# Patient Record
Sex: Male | Born: 1965 | Race: Black or African American | Hispanic: No | Marital: Single | Smoking: Current every day smoker
Health system: Southern US, Community
[De-identification: ages and names within clinical notes are randomized; demographics above are authoritative.]

## PROBLEM LIST (undated history)

## (undated) DIAGNOSIS — I10 Essential (primary) hypertension: Secondary | ICD-10-CM

## (undated) DIAGNOSIS — R7989 Other specified abnormal findings of blood chemistry: Secondary | ICD-10-CM

## (undated) DIAGNOSIS — F1010 Alcohol abuse, uncomplicated: Secondary | ICD-10-CM

## (undated) DIAGNOSIS — H543 Unqualified visual loss, both eyes: Secondary | ICD-10-CM

## (undated) DIAGNOSIS — E119 Type 2 diabetes mellitus without complications: Secondary | ICD-10-CM

## (undated) DIAGNOSIS — K760 Fatty (change of) liver, not elsewhere classified: Secondary | ICD-10-CM

## (undated) HISTORY — DX: Other specified abnormal findings of blood chemistry: R79.89

---

## 2002-01-15 ENCOUNTER — Encounter: Payer: Self-pay | Admitting: Internal Medicine

## 2002-01-15 ENCOUNTER — Emergency Department (HOSPITAL_COMMUNITY): Admission: EM | Admit: 2002-01-15 | Discharge: 2002-01-15 | Payer: Self-pay | Admitting: Emergency Medicine

## 2002-01-17 ENCOUNTER — Emergency Department (HOSPITAL_COMMUNITY): Admission: EM | Admit: 2002-01-17 | Discharge: 2002-01-17 | Payer: Self-pay | Admitting: Emergency Medicine

## 2002-02-10 ENCOUNTER — Ambulatory Visit (HOSPITAL_BASED_OUTPATIENT_CLINIC_OR_DEPARTMENT_OTHER): Admission: RE | Admit: 2002-02-10 | Discharge: 2002-02-10 | Payer: Self-pay | Admitting: *Deleted

## 2002-09-11 ENCOUNTER — Emergency Department (HOSPITAL_COMMUNITY): Admission: EM | Admit: 2002-09-11 | Discharge: 2002-09-11 | Payer: Self-pay | Admitting: Emergency Medicine

## 2002-09-11 ENCOUNTER — Encounter: Payer: Self-pay | Admitting: Emergency Medicine

## 2003-01-27 ENCOUNTER — Inpatient Hospital Stay (HOSPITAL_COMMUNITY): Admission: AC | Admit: 2003-01-27 | Discharge: 2003-02-01 | Payer: Self-pay

## 2003-02-07 ENCOUNTER — Ambulatory Visit (HOSPITAL_COMMUNITY): Admission: RE | Admit: 2003-02-07 | Discharge: 2003-02-08 | Payer: Self-pay | Admitting: Ophthalmology

## 2003-04-11 ENCOUNTER — Ambulatory Visit (HOSPITAL_COMMUNITY): Admission: RE | Admit: 2003-04-11 | Discharge: 2003-04-11 | Payer: Self-pay | Admitting: General Surgery

## 2004-01-30 ENCOUNTER — Ambulatory Visit (HOSPITAL_COMMUNITY): Admission: RE | Admit: 2004-01-30 | Discharge: 2004-01-30 | Payer: Self-pay | Admitting: Ophthalmology

## 2005-07-29 ENCOUNTER — Ambulatory Visit (HOSPITAL_COMMUNITY): Admission: RE | Admit: 2005-07-29 | Discharge: 2005-07-29 | Payer: Self-pay | Admitting: Specialist

## 2008-01-14 ENCOUNTER — Emergency Department (HOSPITAL_COMMUNITY): Admission: EM | Admit: 2008-01-14 | Discharge: 2008-01-14 | Payer: Self-pay | Admitting: Emergency Medicine

## 2008-08-27 ENCOUNTER — Encounter: Payer: Self-pay | Admitting: Emergency Medicine

## 2008-08-28 ENCOUNTER — Ambulatory Visit: Payer: Self-pay | Admitting: *Deleted

## 2008-08-28 ENCOUNTER — Inpatient Hospital Stay (HOSPITAL_COMMUNITY): Admission: EM | Admit: 2008-08-28 | Discharge: 2008-08-31 | Payer: Self-pay | Admitting: *Deleted

## 2009-10-15 ENCOUNTER — Emergency Department (HOSPITAL_COMMUNITY): Admission: EM | Admit: 2009-10-15 | Discharge: 2009-10-15 | Payer: Self-pay | Admitting: Emergency Medicine

## 2010-06-16 LAB — COMPREHENSIVE METABOLIC PANEL
ALT: 104 U/L — ABNORMAL HIGH (ref 0–53)
AST: 81 U/L — ABNORMAL HIGH (ref 0–37)
Albumin: 4.4 g/dL (ref 3.5–5.2)
Alkaline Phosphatase: 61 U/L (ref 39–117)
BUN: 10 mg/dL (ref 6–23)
CO2: 26 mEq/L (ref 19–32)
Calcium: 9.4 mg/dL (ref 8.4–10.5)
Chloride: 103 mEq/L (ref 96–112)
Creatinine, Ser: 1.3 mg/dL (ref 0.4–1.5)
GFR calc Af Amer: >60 mL/min (ref >60)
GFR calc non Af Amer: 60 mL/min — ABNORMAL LOW (ref >60)
Glucose, Bld: 103 mg/dL — ABNORMAL HIGH (ref 70–99)
Potassium: 4.5 mEq/L (ref 3.5–5.1)
Sodium: 137 mEq/L (ref 135–145)
Total Bilirubin: 0.9 mg/dL (ref 0.3–1.2)
Total Protein: 8.2 g/dL (ref 6.0–8.3)

## 2010-06-16 LAB — POCT CARDIAC MARKERS
CKMB, poc: 2 ng/mL (ref 1.0–8.0)
CKMB, poc: 2.4 ng/mL (ref 1.0–8.0)
Myoglobin, poc: 171 ng/mL (ref 12–200)
Myoglobin, poc: 190 ng/mL (ref 12–200)
Troponin i, poc: <0.05 ng/mL (ref 0.00–0.09)
Troponin i, poc: <0.05 ng/mL (ref 0.00–0.09)

## 2010-06-16 LAB — CBC
HCT: 45.6 % (ref 39.0–52.0)
Hemoglobin: 15.9 g/dL (ref 13.0–17.0)
MCH: 33.7 pg (ref 26.0–34.0)
MCHC: 34.8 g/dL (ref 30.0–36.0)
MCV: 96.9 fL (ref 78.0–100.0)
Platelets: 236 10*3/uL (ref 150–400)
RBC: 4.71 MIL/uL (ref 4.22–5.81)
RDW: 13.7 % (ref 11.5–15.5)
WBC: 8.1 10*3/uL (ref 4.0–10.5)

## 2010-06-16 LAB — LIPASE, BLOOD: Lipase: 35 U/L (ref 11–59)

## 2010-07-10 LAB — DIFFERENTIAL
Basophils Absolute: 0.1 10*3/uL (ref 0.0–0.1)
Basophils Relative: 1 % (ref 0–1)
Eosinophils Absolute: 0.1 10*3/uL (ref 0.0–0.7)
Eosinophils Relative: 2 % (ref 0–5)
Lymphocytes Relative: 27 % (ref 12–46)
Lymphs Abs: 1.8 10*3/uL (ref 0.7–4.0)
Monocytes Absolute: 0.8 10*3/uL (ref 0.1–1.0)
Monocytes Relative: 12 % (ref 3–12)
Neutro Abs: 3.8 10*3/uL (ref 1.7–7.7)
Neutrophils Relative %: 58 % (ref 43–77)

## 2010-07-10 LAB — CBC
HCT: 45.4 % (ref 39.0–52.0)
Hemoglobin: 16 g/dL (ref 13.0–17.0)
MCHC: 35.3 g/dL (ref 30.0–36.0)
MCV: 96 fL (ref 78.0–100.0)
Platelets: 268 10*3/uL (ref 150–400)
RBC: 4.73 MIL/uL (ref 4.22–5.81)
RDW: 14.1 % (ref 11.5–15.5)
WBC: 6.5 10*3/uL (ref 4.0–10.5)

## 2010-07-10 LAB — URINALYSIS, ROUTINE W REFLEX MICROSCOPIC
Bilirubin Urine: NEGATIVE
Glucose, UA: NEGATIVE mg/dL
Hgb urine dipstick: NEGATIVE
Ketones, ur: NEGATIVE mg/dL
Nitrite: NEGATIVE
Protein, ur: NEGATIVE mg/dL
Specific Gravity, Urine: >1.03 — ABNORMAL HIGH (ref 1.005–1.030)
Urobilinogen, UA: 0.2 mg/dL (ref 0.0–1.0)
pH: 5.5 (ref 5.0–8.0)

## 2010-07-10 LAB — HEPATIC FUNCTION PANEL
ALT: 35 U/L (ref 0–53)
AST: 38 U/L — ABNORMAL HIGH (ref 0–37)
Albumin: 4.4 g/dL (ref 3.5–5.2)
Alkaline Phosphatase: 71 U/L (ref 39–117)
Bilirubin, Direct: 0.1 mg/dL (ref 0.0–0.3)
Indirect Bilirubin: 0.3 mg/dL (ref 0.3–0.9)
Total Bilirubin: 0.4 mg/dL (ref 0.3–1.2)
Total Protein: 8.2 g/dL (ref 6.0–8.3)

## 2010-07-10 LAB — ACETAMINOPHEN LEVEL: Acetaminophen (Tylenol), Serum: <10 ug/mL — ABNORMAL LOW (ref 10–30)

## 2010-07-10 LAB — ETHANOL: Alcohol, Ethyl (B): <5 mg/dL (ref 0–10)

## 2010-07-10 LAB — RAPID URINE DRUG SCREEN, HOSP PERFORMED
Amphetamines: NOT DETECTED
Barbiturates: NOT DETECTED
Benzodiazepines: NOT DETECTED
Cocaine: NOT DETECTED
Opiates: NOT DETECTED
Tetrahydrocannabinol: NOT DETECTED

## 2010-07-10 LAB — BASIC METABOLIC PANEL
BUN: 11 mg/dL (ref 6–23)
CO2: 26 mEq/L (ref 19–32)
Calcium: 10 mg/dL (ref 8.4–10.5)
Chloride: 105 mEq/L (ref 96–112)
Creatinine, Ser: 1.03 mg/dL (ref 0.4–1.5)
GFR calc Af Amer: >60 mL/min (ref >60)
GFR calc non Af Amer: >60 mL/min (ref >60)
Glucose, Bld: 95 mg/dL (ref 70–99)
Potassium: 4.3 mEq/L (ref 3.5–5.1)
Sodium: 139 mEq/L (ref 135–145)

## 2010-07-10 LAB — SALICYLATE LEVEL: Salicylate Lvl: <4 mg/dL (ref 2.8–20.0)

## 2010-07-12 ENCOUNTER — Emergency Department: Payer: Self-pay
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
## 2010-08-17 NOTE — Op Note (Signed)
NAME:  Austin Berry, CORAN NO.:  [id number]   MEDICAL RECORD NO.:  [id number]          PATIENT TYPE:  AMB   LOCATION:  SDS                          FACILITY:  [facility]   PHYSICIAN:  Herby Abraham., M.D.DATE OF BIRTH:  10-Jun-1965   DATE OF PROCEDURE:  DATE OF DISCHARGE:                                 OPERATIVE REPORT   No dictation for this job.           ______________________________  Herby Abraham., M.D.     LC/MEDQ  D:  07/29/2005  T:  07/30/2005  Job:  [id number]
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
## 2010-08-17 NOTE — Op Note (Signed)
   NAME:  Austin Berry, Austin Berry                         ACCOUNT NO.:  [id number]   MEDICAL RECORD NO.:  [id number]                   PATIENT TYPE:  AMB   LOCATION:  DSC                                  FACILITY:  [facility]   PHYSICIAN:  Lowell Bouton, M.D.      DATE OF BIRTH:  06-Apr-1965   DATE OF PROCEDURE:  02/10/2002  DATE OF DISCHARGE:                                 OPERATIVE REPORT   PREOPERATIVE DIAGNOSIS:  Crush amputation, left ring fingertip.   POSTOPERATIVE DIAGNOSIS:  Crush amputation, left ring fingertip.   PROCEDURE:  Revision of amputation, left ring fingertip.   SURGEON:  Lowell Bouton, M.D.   ANESTHESIA:  Marcaine 0.5% local in the minor room.   OPERATIVE FINDINGS:  The patient had necrosis of a portion of the distal  phalanx.  The skin was missing on the ulnar half of the distal phalanx.   DESCRIPTION OF PROCEDURE:  Under 0.5% Marcaine local anesthesia in the minor  room, the left hand was prepped and draped in the usual fashion and after  elevating the limb, the forearm tourniquet was inflated to 250 mmHg.  A  rongeur was used to debride the dead bone of the distal phalanx.  A radial  flap was then elevated and the bone was excised.  Any necrotic bone was  removed.  The radial flap was debrided and then brought ulnarly to cover  over the remaining bone.  The wound was irrigated copiously with saline.  The skin was repaired with 4-0 nylon sutures.  The nail bed and about 30% of  the proximal nail bed was left intact.  Sterile dressings were applied, the  tourniquet was released.  The patient was discharged in good condition.                                               Lowell Bouton, M.D.    EMM/MEDQ  D:  02/10/2002  T:  02/11/2002  Job:  [id number]

## 2010-12-25 ENCOUNTER — Emergency Department: Payer: Self-pay | Admitting: Emergency Medicine

## 2010-12-31 LAB — CBC
HCT: 46.7
MCV: 97
Platelets: 243
RDW: 13.9

## 2010-12-31 LAB — DIFFERENTIAL
Basophils Absolute: 0
Lymphocytes Relative: 17
Lymphs Abs: 1.2
Monocytes Absolute: 0.8
Monocytes Relative: 12
Neutro Abs: 5

## 2010-12-31 LAB — POCT CARDIAC MARKERS: Troponin i, poc: <0.05

## 2010-12-31 LAB — COMPREHENSIVE METABOLIC PANEL
Albumin: 3.9
BUN: 9
Calcium: 9
Creatinine, Ser: 1.1
Total Protein: 7.2

## 2011-06-03 ENCOUNTER — Emergency Department: Payer: Self-pay | Admitting: Emergency Medicine

## 2011-06-03 LAB — TSH: Thyroid Stimulating Horm: 0.59 u[IU]/mL

## 2011-06-03 LAB — CBC
HCT: 47.8 % (ref 40.0–52.0)
HGB: 16 g/dL (ref 13.0–18.0)
MCH: 33.1 pg (ref 26.0–34.0)
MCHC: 33.6 g/dL (ref 32.0–36.0)
MCV: 99 fL (ref 80–100)
RBC: 4.85 10*6/uL (ref 4.40–5.90)
WBC: 5.1 10*3/uL (ref 3.8–10.6)

## 2011-06-03 LAB — COMPREHENSIVE METABOLIC PANEL
Albumin: 3.8 g/dL (ref 3.4–5.0)
Anion Gap: 12 (ref 7–16)
Bilirubin,Total: 0.6 mg/dL (ref 0.2–1.0)
Calcium, Total: 9.3 mg/dL (ref 8.5–10.1)
Co2: 24 mmol/L (ref 21–32)
Creatinine: 1.14 mg/dL (ref 0.60–1.30)
EGFR (Non-African Amer.): >60
Glucose: 144 mg/dL — ABNORMAL HIGH (ref 65–99)
Osmolality: 278 (ref 275–301)
Sodium: 138 mmol/L (ref 136–145)

## 2011-06-03 LAB — TROPONIN I: Troponin-I: <0.02 ng/mL

## 2011-06-03 LAB — DRUG SCREEN, URINE
Barbiturates, Ur Screen: NEGATIVE (ref ?–200)
Cannabinoid 50 Ng, Ur ~~LOC~~: POSITIVE (ref ?–50)
Methadone, Ur Screen: NEGATIVE (ref ?–300)
Tricyclic, Ur Screen: NEGATIVE (ref ?–1000)

## 2012-01-30 ENCOUNTER — Ambulatory Visit: Payer: Self-pay | Admitting: Family Medicine

## 2012-06-01 ENCOUNTER — Emergency Department: Payer: Self-pay | Admitting: Emergency Medicine

## 2012-06-01 LAB — DRUG SCREEN, URINE
Benzodiazepine, Ur Scrn: NEGATIVE (ref ?–200)
Cannabinoid 50 Ng, Ur ~~LOC~~: POSITIVE (ref ?–50)
Cocaine Metabolite,Ur ~~LOC~~: POSITIVE (ref ?–300)
Opiate, Ur Screen: NEGATIVE (ref ?–300)
Phencyclidine (PCP) Ur S: NEGATIVE (ref ?–25)
Tricyclic, Ur Screen: NEGATIVE (ref ?–1000)

## 2012-06-01 LAB — COMPREHENSIVE METABOLIC PANEL
Albumin: 3.5 g/dL (ref 3.4–5.0)
Anion Gap: 1 — ABNORMAL LOW (ref 7–16)
Bilirubin,Total: 0.4 mg/dL (ref 0.2–1.0)
Creatinine: 1.28 mg/dL (ref 0.60–1.30)
Glucose: 154 mg/dL — ABNORMAL HIGH (ref 65–99)
Osmolality: 280 (ref 275–301)
SGOT(AST): 23 U/L (ref 15–37)
Total Protein: 7.7 g/dL (ref 6.4–8.2)

## 2012-06-01 LAB — URINALYSIS, COMPLETE
Blood: NEGATIVE
Glucose,UR: 150 mg/dL (ref 0–75)
Ketone: NEGATIVE
Protein: NEGATIVE
Squamous Epithelial: 1

## 2012-06-01 LAB — CBC
HCT: 46.4 % (ref 40.0–52.0)
MCHC: 33.3 g/dL (ref 32.0–36.0)
RBC: 4.76 10*6/uL (ref 4.40–5.90)
WBC: 9.7 10*3/uL (ref 3.8–10.6)

## 2012-06-08 ENCOUNTER — Emergency Department: Payer: Self-pay | Admitting: Emergency Medicine

## 2012-06-08 LAB — URINALYSIS, COMPLETE
Bacteria: NONE SEEN
Blood: NEGATIVE
Glucose,UR: >=500 mg/dL (ref 0–75)
Ketone: NEGATIVE
Leukocyte Esterase: NEGATIVE
Ph: 7 (ref 4.5–8.0)

## 2012-06-08 LAB — DRUG SCREEN, URINE
Benzodiazepine, Ur Scrn: POSITIVE (ref ?–200)
Cocaine Metabolite,Ur ~~LOC~~: NEGATIVE (ref ?–300)
Methadone, Ur Screen: NEGATIVE (ref ?–300)
Tricyclic, Ur Screen: NEGATIVE (ref ?–1000)

## 2012-06-08 LAB — COMPREHENSIVE METABOLIC PANEL
Albumin: 3.6 g/dL (ref 3.4–5.0)
BUN: 13 mg/dL (ref 7–18)
Bilirubin,Total: 0.1 mg/dL — ABNORMAL LOW (ref 0.2–1.0)
Glucose: 269 mg/dL — ABNORMAL HIGH (ref 65–99)
Potassium: 3.9 mmol/L (ref 3.5–5.1)
SGPT (ALT): 34 U/L (ref 12–78)

## 2012-06-08 LAB — CBC
HCT: 46.8 % (ref 40.0–52.0)
MCHC: 34 g/dL (ref 32.0–36.0)
RDW: 12.8 % (ref 11.5–14.5)
WBC: 9.8 10*3/uL (ref 3.8–10.6)

## 2012-06-08 LAB — ETHANOL
Ethanol %: <0.003 % (ref 0.000–0.080)
Ethanol: <3 mg/dL

## 2014-08-03 ENCOUNTER — Emergency Department
Admission: EM | Admit: 2014-08-03 | Discharge: 2014-08-05 | Disposition: A | Discharge status: Home / Self Care | Payer: Medicaid Other | Attending: Emergency Medicine | Admitting: Emergency Medicine

## 2014-08-03 DIAGNOSIS — F1210 Cannabis abuse, uncomplicated: Secondary | ICD-10-CM | POA: Insufficient documentation

## 2014-08-03 DIAGNOSIS — F99 Mental disorder, not otherwise specified: Secondary | ICD-10-CM | POA: Diagnosis present

## 2014-08-03 DIAGNOSIS — F1910 Other psychoactive substance abuse, uncomplicated: Secondary | ICD-10-CM

## 2014-08-03 DIAGNOSIS — F4310 Post-traumatic stress disorder, unspecified: Secondary | ICD-10-CM

## 2014-08-03 DIAGNOSIS — Z79899 Other long term (current) drug therapy: Secondary | ICD-10-CM | POA: Diagnosis not present

## 2014-08-03 DIAGNOSIS — F1410 Cocaine abuse, uncomplicated: Secondary | ICD-10-CM | POA: Diagnosis not present

## 2014-08-03 DIAGNOSIS — H547 Unspecified visual loss: Secondary | ICD-10-CM

## 2014-08-03 DIAGNOSIS — F1010 Alcohol abuse, uncomplicated: Secondary | ICD-10-CM | POA: Diagnosis not present

## 2014-08-03 DIAGNOSIS — I10 Essential (primary) hypertension: Secondary | ICD-10-CM | POA: Diagnosis not present

## 2014-08-03 HISTORY — DX: Essential (primary) hypertension: I10

## 2014-08-03 HISTORY — DX: Type 2 diabetes mellitus without complications: E11.9

## 2014-08-03 HISTORY — DX: Unqualified visual loss, both eyes: H54.3

## 2014-08-03 LAB — URINALYSIS COMPLETE WITH MICROSCOPIC (ARMC ONLY)
BACTERIA UA: NONE SEEN
BILIRUBIN URINE: NEGATIVE
Glucose, UA: NEGATIVE mg/dL
HGB URINE DIPSTICK: NEGATIVE
KETONES UR: NEGATIVE mg/dL
Nitrite: NEGATIVE
PH: 5 (ref 5.0–8.0)
Protein, ur: NEGATIVE mg/dL
SPECIFIC GRAVITY, URINE: 1.023 (ref 1.005–1.030)

## 2014-08-03 NOTE — ED Notes (Signed)
Pt here for alcohol and crack detox, denies any SI or HI.

## 2014-08-03 NOTE — ED Notes (Signed)
Pt changed into scrubs and belongings put in secure locker area 

## 2014-08-04 DIAGNOSIS — F1410 Cocaine abuse, uncomplicated: Secondary | ICD-10-CM

## 2014-08-04 DIAGNOSIS — F4310 Post-traumatic stress disorder, unspecified: Secondary | ICD-10-CM

## 2014-08-04 DIAGNOSIS — F1010 Alcohol abuse, uncomplicated: Secondary | ICD-10-CM

## 2014-08-04 DIAGNOSIS — H547 Unspecified visual loss: Secondary | ICD-10-CM

## 2014-08-04 DIAGNOSIS — I10 Essential (primary) hypertension: Secondary | ICD-10-CM

## 2014-08-04 LAB — CBC
HCT: 44.5 % (ref 40.0–52.0)
Hemoglobin: 14.8 g/dL (ref 13.0–18.0)
MCH: 32.1 pg (ref 26.0–34.0)
MCHC: 33.2 g/dL (ref 32.0–36.0)
MCV: 96.7 fL (ref 80.0–100.0)
Platelets: 289 10*3/uL (ref 150–440)
RBC: 4.6 MIL/uL (ref 4.40–5.90)
RDW: 12.9 % (ref 11.5–14.5)
WBC: 6.6 10*3/uL (ref 3.8–10.6)

## 2014-08-04 LAB — ETHANOL: Alcohol, Ethyl (B): <5 mg/dL (ref <5)

## 2014-08-04 LAB — COMPREHENSIVE METABOLIC PANEL
ALK PHOS: 66 U/L (ref 38–126)
ALT: 34 U/L (ref 17–63)
ANION GAP: 7 (ref 5–15)
AST: 33 U/L (ref 15–41)
Albumin: 4.1 g/dL (ref 3.5–5.0)
BUN: 17 mg/dL (ref 6–20)
CHLORIDE: 108 mmol/L (ref 101–111)
CO2: 25 mmol/L (ref 22–32)
Calcium: 9 mg/dL (ref 8.9–10.3)
Creatinine, Ser: 1.06 mg/dL (ref 0.61–1.24)
GLUCOSE: 116 mg/dL — AB (ref 65–99)
POTASSIUM: 3.7 mmol/L (ref 3.5–5.1)
SODIUM: 140 mmol/L (ref 135–145)
TOTAL PROTEIN: 7.7 g/dL (ref 6.5–8.1)
Total Bilirubin: 0.4 mg/dL (ref 0.3–1.2)

## 2014-08-04 LAB — URINE DRUG SCREEN, QUALITATIVE (ARMC ONLY)
AMPHETAMINES, UR SCREEN: NOT DETECTED
BENZODIAZEPINE, UR SCRN: NOT DETECTED
Barbiturates, Ur Screen: NOT DETECTED
Cannabinoid 50 Ng, Ur ~~LOC~~: POSITIVE — AB
Cocaine Metabolite,Ur ~~LOC~~: POSITIVE — AB
MDMA (Ecstasy)Ur Screen: NOT DETECTED
METHADONE SCREEN, URINE: NOT DETECTED
Opiate, Ur Screen: NOT DETECTED
PHENCYCLIDINE (PCP) UR S: NOT DETECTED
TRICYCLIC, UR SCREEN: NOT DETECTED

## 2014-08-04 LAB — SALICYLATE LEVEL

## 2014-08-04 LAB — GLUCOSE, CAPILLARY: Glucose-Capillary: 116 mg/dL — ABNORMAL HIGH (ref 70–99)

## 2014-08-04 LAB — ACETAMINOPHEN LEVEL

## 2014-08-04 NOTE — ED Provider Notes (Signed)
[facility] Emergency Department Provider Note  ____________________________________________  Time seen: 11:40 PM  I have reviewed the triage vital signs and the nursing notes.   HISTORY  Chief Complaint Mental Health Problem    HPI Austin Berry is a 49 y.o. male who reports a long history of alcohol and crack cocaine abuse. He presents requesting detox. He denies any SI, HI, age, VH. No acute medical complaints. No chest pain, shortness of breath, headache, dizziness, abdominal pain, back pain.Last drink was this morning, patient denies any serious withdrawal symptoms in the past.     No past medical history on file.  There are no active problems to display for this patient.   No past surgical history on file.  Current Outpatient Rx  Name  Route  Sig  Dispense  Refill  . LISINOPRIL PO   Oral   Take by mouth.         . METFORMIN HCL PO   Oral   Take by mouth.         . Omeprazole (PRILOSEC PO)   Oral   Take by mouth.           Allergies Review of patient's allergies indicates no known allergies.  No family history on file.  Social History History  Substance Use Topics  . Smoking status: Not on file  . Smokeless tobacco: Not on file  . Alcohol Use: Not on file    Review of Systems  Constitutional: No fever or chills. No weight changes Eyes: Baseline blindness ENT: No sore throat. Cardiovascular: No chest pain. Respiratory: No dyspnea or cough. Gastrointestinal: Negative for abdominal pain, vomiting and diarrhea.  No BRBPR or melena. Genitourinary: Negative for dysuria, urinary retention, bloody urine, or difficulty urinating. Musculoskeletal: Negative for back pain. No joint swelling or pain. Skin: Negative for rash. Neurological: Negative for headaches, focal weakness or numbness. Psychiatric:No anxiety or depression.   Endocrine:No hot/cold intolerance, changes in energy, or sleep difficulty.  10-point ROS  otherwise negative.  ____________________________________________   PHYSICAL EXAM:  VITAL SIGNS: ED Triage Vitals  Enc Vitals Group     BP 08/03/14 2249 161/121 mmHg     Pulse Rate 08/03/14 2249 75     Resp 08/03/14 2249 18     Temp 08/03/14 2249 98.8 F (37.1 C)     Temp Source 08/03/14 2249 Oral     SpO2 08/03/14 2249 98 %     Weight 08/03/14 2249 200 lb (90.719 kg)     Height 08/03/14 2249 6' (1.829 m)     Head Cir --      Peak Flow --      Pain Score --      Pain Loc --      Pain Edu? --      Excl. in GC? --      Constitutional: Alert and oriented. Well appearing and in no distress. Eyes: No scleral icterus. No conjunctival pallor. PERRL. EOMI ENT   Head: Normocephalic and atraumatic.   Nose: No congestion/rhinnorhea. No septal hematoma   Mouth/Throat: MMM, no pharyngeal erythema   Neck: No stridor. No SubQ emphysema.  Hematological/Lymphatic/Immunilogical: No cervical lymphadenopathy. Cardiovascular: RRR. Normal and symmetric distal pulses are present in all extremities. No murmurs, rubs, or gallops. Respiratory: Normal respiratory effort without tachypnea nor retractions. Breath sounds are clear and equal bilaterally. No wheezes/rales/rhonchi. Gastrointestinal: Soft and nontender. No distention. There is no CVA tenderness.  No rebound, rigidity, or guarding. Genitourinary: deferred Musculoskeletal: Nontender  with normal range of motion in all extremities. No joint effusions.  No lower extremity tenderness.  No edema. Neurologic:   Normal speech and language.  CN 2-10 normal. Motor grossly intact. No pronator drift.  Normal gait. No gross focal neurologic deficits are appreciated.  Skin:  Skin is warm, dry and intact. No rash noted.  No petechiae, purpura, or bullae. Psychiatric: Mood and affect are normal. Speech and behavior are normal. Patient exhibits appropriate insight and judgment.  ____________________________________________    LABS  (pertinent positives/negatives)  Results for orders placed or performed during the hospital encounter of 08/03/14  CBC  Result Value Ref Range   WBC 6.6 3.8 - 10.6 K/uL   RBC 4.60 4.40 - 5.90 MIL/uL   Hemoglobin 14.8 13.0 - 18.0 g/dL   HCT 96.0 45.4 - 09.8 %   MCV 96.7 80.0 - 100.0 fL   MCH 32.1 26.0 - 34.0 pg   MCHC 33.2 32.0 - 36.0 g/dL   RDW 11.9 14.7 - 82.9 %   Platelets 289 150 - 440 K/uL  Comprehensive metabolic panel  Result Value Ref Range   Sodium 140 135 - 145 mmol/L   Potassium 3.7 3.5 - 5.1 mmol/L   Chloride 108 101 - 111 mmol/L   CO2 25 22 - 32 mmol/L   Glucose, Bld 116 (H) 65 - 99 mg/dL   BUN 17 6 - 20 mg/dL   Creatinine, Ser 5.62 0.61 - 1.24 mg/dL   Calcium 9.0 8.9 - 13.0 mg/dL   Total Protein 7.7 6.5 - 8.1 g/dL   Albumin 4.1 3.5 - 5.0 g/dL   AST 33 15 - 41 U/L   ALT 34 17 - 63 U/L   Alkaline Phosphatase 66 38 - 126 U/L   Total Bilirubin 0.4 0.3 - 1.2 mg/dL   GFR calc non Af Amer >60 >60 mL/min   GFR calc Af Amer >60 >60 mL/min   Anion gap 7 5 - 15  Urinalysis complete, with microscopic [facility])  Result Value Ref Range   Color, Urine YELLOW (A) YELLOW   APPearance CLEAR (A) CLEAR   Glucose, UA NEGATIVE NEGATIVE mg/dL   Bilirubin Urine NEGATIVE NEGATIVE   Ketones, ur NEGATIVE NEGATIVE mg/dL   Specific Gravity, Urine 1.023 1.005 - 1.030   Hgb urine dipstick NEGATIVE NEGATIVE   pH 5.0 5.0 - 8.0   Protein, ur NEGATIVE NEGATIVE mg/dL   Nitrite NEGATIVE NEGATIVE   Leukocytes, UA TRACE (A) NEGATIVE   RBC / HPF 0-5 0 - 5 RBC/hpf   WBC, UA 0-5 0 - 5 WBC/hpf   Bacteria, UA NONE SEEN NONE SEEN   Squamous Epithelial / LPF 0-5 (A) NONE SEEN   Mucous PRESENT   Ethanol  Result Value Ref Range   Alcohol, Ethyl (B) <5 <5 mg/dL  Urine Drug Screen, Qualitative [facility])  Result Value Ref Range   Tricyclic, Ur Screen NONE DETECTED NONE DETECTED   Amphetamines, Ur Screen NONE DETECTED NONE DETECTED   MDMA (Ecstasy)Ur Screen NONE DETECTED NONE DETECTED   Cocaine  Metabolite,Ur [place] POSITIVE (A) NONE DETECTED   Opiate, Ur Screen NONE DETECTED NONE DETECTED   Phencyclidine (PCP) Ur S NONE DETECTED NONE DETECTED   Cannabinoid 50 Ng, Ur [place] POSITIVE (A) NONE DETECTED   Barbiturates, Ur Screen NONE DETECTED NONE DETECTED   Benzodiazepine, Ur Scrn NONE DETECTED NONE DETECTED   Methadone Scn, Ur NONE DETECTED NONE DETECTED  Acetaminophen level  Result Value Ref Range   Acetaminophen (Tylenol), Serum <10 (L) 10 -  30 ug/mL  Salicylate level  Result Value Ref Range   Salicylate Lvl <4.0 2.8 - 30.0 mg/dL     ____________________________________________   EKG    ____________________________________________    RADIOLOGY    ____________________________________________   PROCEDURES  ____________________________________________   INITIAL IMPRESSION / ASSESSMENT AND PLAN / ED COURSE  Pertinent labs & imaging results that were available during my care of the patient were reviewed by me and considered in my medical decision making (see chart for details).  The patient presents requesting evaluation for detox. He does not appear to be in withdrawal at this time and seems to be low risk for serious withdrawal syndrome. There is no concern for his safety and he does not require involuntary commitment for psychiatric evaluation. He has no acute medical complaints and is medically stable at this time. We will consult the internal medicine team for further evaluation coordination of care related to detox.  ____________________________________________   FINAL CLINICAL IMPRESSION(S) / ED DIAGNOSES  Final diagnoses:  Polysubstance abuse  Alcohol abuse      Sharman CheekPhillip , MD 08/04/14 [phone]

## 2014-08-04 NOTE — ED Notes (Signed)
BEHAVIORAL HEALTH ROUNDING Patient sleeping: Yes.   Patient alert and oriented: not applicable Behavior appropriate: Yes.    Nutrition and fluids offered: No Toileting and hygiene offered: No Sitter present: yes Law enforcement present: Yes ODS 

## 2014-08-04 NOTE — ED Notes (Signed)
BEHAVIORAL HEALTH ROUNDING Patient sleeping: No. Patient alert and oriented: yes Behavior appropriate: Yes.  ;  Nutrition and fluids offered: Yes  Toileting and hygiene offered: Yes  Sitter present: no Law enforcement present: Yes   

## 2014-08-04 NOTE — ED Notes (Signed)
BEHAVIORAL HEALTH ROUNDING Patient sleeping: No. Patient alert and oriented: no Behavior appropriate: Yes.   Nutrition and fluids offered: Yes  Toileting and hygiene offered: Yes  Sitter present: no - tech in hallway Law enforcement present: Yes bpd

## 2014-08-04 NOTE — ED Notes (Signed)
Patient assigned to appropriate care area. Patient oriented to unit/care area: Informed that, for their safety, care areas are designed for safety and monitored by security cameras at all times; and visiting hours explained to patient. Patient verbalizes understanding, and verbal contract for safety obtained. 

## 2014-08-04 NOTE — ED Notes (Signed)
ED BHU PLACEMENT JUSTIFICATION Is the patient under IVC or is there intent for IVC: Yes.   Is the patient medically cleared: Yes.   Is there vacancy in the ED BHU: Yes.   Is the population mix appropriate for patient: Yes.   Is the patient awaiting placement in inpatient or outpatient setting: No. Has the patient had a psychiatric consult: Yes.   Survey of unit performed for contraband, proper placement and condition of furniture, tampering with fixtures in bathroom, shower, and each patient room: Yes.   APPEARANCE/BEHAVIOR calm, cooperative and adequate rapport can be established NEURO ASSESSMENT Orientation: time, place and person Hallucinations: No.None noted (Hallucinations) Speech: Normal Gait: normal RESPIRATORY ASSESSMENT Normal expansion.  Clear to auscultation.  No rales, rhonchi, or wheezing. CARDIOVASCULAR ASSESSMENT regular rate and rhythm, S1, S2 normal, no murmur, click, rub or gallop GASTROINTESTINAL ASSESSMENT soft, nontender, BS WNL, no r/g EXTREMITIES normal strength, tone, and muscle mass PLAN OF CARE Provide calm/safe environment. Vital signs assessed twice daily. ED BHU Assessment once each 12-hour shift. Collaborate with intake RN daily or as condition indicates. Assure the ED provider has rounded once each shift. Provide and encourage hygiene. Provide redirection as needed. Assess for escalating behavior; address immediately and inform ED provider.  Assess family dynamic and appropriateness for visitation as needed: Yes.   Educate the patient/family about BHU procedures/visitation: Yes.
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
## 2014-08-04 NOTE — ED Notes (Signed)

## 2014-08-04 NOTE — ED Notes (Signed)
BEHAVIORAL HEALTH ROUNDING Patient sleeping: No. Patient alert and oriented: yes Behavior appropriate: Yes.    Nutrition and fluids offered: Yes  Toileting and hygiene offered: Yes  Sitter present: yes Law enforcement present: Yes O[facility] 

## 2014-08-04 NOTE — [facility] Counselor (Signed)
Spoke with [facility] (Carolyn-[phone]), pt. Is unable to be admitted to their facility. Spoke with [facility] and they recommended [facility]. Spoke with [facility] about possible admission to their facility. Due to the pt. Having no alcohol in his system and only THC and Cocaine, he didn't meet entrance criteria for their detox facility. However, he is able to be admitted to their Treatment Program and the wait time is longer then the wait time for the detox portion. Information forwarded to Psych MD (Dr. Toni Amendlapacs).

## 2014-08-04 NOTE — ED Notes (Signed)
Pt came to ER requesting transfer to [facility] for cocaine and ETOH detox. PT denies SI/HI/HI/AH/VH. Pt denies pain at this time. Pt verbalizes in full sentences. No other medical needs verbalized at this time

## 2014-08-04 NOTE — ED Provider Notes (Signed)
-----------------------------------------   5:49 PM on 08/04/2014 -----------------------------------------   BP 187/103 mmHg  Pulse 66  Temp(Src) 97.3 F (36.3 C) (Oral)  Resp 16  Ht 6' (1.829 m)  Wt 200 lb (90.719 kg)  BMI 27.12 kg/m2  SpO2 97%  The patient had no acute events. Calm and cooperative at this time.  Disposition is pending per Psychiatry/Behavioral Medicine team recommendations.    Sherlyn HaySheryl L , DO 08/04/14 1749

## 2014-08-04 NOTE — BH Assessment (Signed)
Assessment Note  Austin Berry is an 49 y.o. male, who presents to the ED via a friend requesting assistance with alcohol and crack cocaine detox. Per patient, "I have been using for years; I'm tired of it; I need help; I can't do it by myself; I want to go to [facility]; I'm not suicidal or want to hurt anybody else; I'm just here for detox."  Axis I: Substance Abuse Axis II: Deferred Axis III: No past medical history on file. Axis IV: problems related to social environment, problems with access to health care services and problems with primary support group Axis V: 61-70 mild symptoms  Past Medical History: No past medical history on file.  No past surgical history on file.  Family History: No family history on file.  Social History:  has no tobacco, alcohol, and drug history on file.  Additional Social History:     CIWA: CIWA-Ar BP: (!) 161/121 mmHg Pulse Rate: 75 COWS:    Allergies: No Known Allergies  Home Medications:  (Not in a hospital admission)  OB/GYN Status:  No LMP for male patient.  General Assessment Data Location of Assessment: [facility] ED TTS Assessment: In system Is this a Tele or Face-to-Face Assessment?: Face-to-Face Is this an Initial Assessment or a Re-assessment for this encounter?: Initial Assessment Marital status: Single Maiden name: none Is patient pregnant?: No Pregnancy Status: No Living Arrangements: Alone Can pt return to current living arrangement?: Yes Admission Status: Voluntary Is patient capable of signing voluntary admission?: Yes Referral Source: Self/Family/Friend Insurance type: Medicaid  Medical Screening Exam [facility] Walk-in ONLY) Medical Exam completed: Yes  Crisis Care Plan Living Arrangements: Alone Name of Psychiatrist: none Name of Therapist: none  Education Status Is patient currently in school?: No  Risk to self with the past 6 months Suicidal Ideation: No Has patient been a risk to self within the past 6 months prior  to admission? : No Suicidal Intent: No Has patient had any suicidal intent within the past 6 months prior to admission? : No Is patient at risk for suicide?: No Suicidal Plan?: No Has patient had any suicidal plan within the past 6 months prior to admission? : No Access to Means: No What has been your use of drugs/alcohol within the last 12 months?:  (alcohol and crack cocaine; alcohol started 49 y.o.; has been) Previous Attempts/Gestures: No How many times?: 0 Other Self Harm Risks: drug use Triggers for Past Attempts: None known Intentional Self Injurious Behavior: None Family Suicide History: No Recent stressful life event(s): Other (Comment) (drug and alcohol use) Persecutory voices/beliefs?: Yes Depression: No Depression Symptoms:  (denies) Substance abuse history and/or treatment for substance abuse?: Yes Suicide prevention information given to non-admitted patients: Yes  Risk to Others within the past 6 months Homicidal Ideation: No Does patient have any lifetime risk of violence toward others beyond the six months prior to admission? 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) Patent attorneyye Contact: Unable to Assess (client is legally blind; see's shadows; utilizes an assistiv) Motor Activity:  (  walks with assistance) Speech: Unremarkable Level of Consciousness: Alert Mood: Pleasant Affect: Anxious Anxiety Level: Minimal Thought Processes: Coherent Judgement:  Partial Orientation: Person, Place Obsessive Compulsive Thoughts/Behaviors: None  Cognitive Functioning Concentration: Normal Memory: Recent Intact, Remote Intact IQ: Average Insight: Fair Impulse Control: Good Appetite: Good Weight Loss: 0 Weight Gain: 0 Sleep: No Change Total Hours of Sleep: 8 Vegetative Symptoms: None  ADLScreening [facility] Assessment Services) Patient's cognitive ability adequate to safely complete daily activities?: Yes Patient able to express need for assistance with ADLs?: Yes Independently performs ADLs?: No  Prior Inpatient Therapy Prior Inpatient Therapy: Yes Prior Therapy Dates: 3 years ago Prior Therapy Facilty/Provider(s): [facility] Reason for Treatment: detox  Prior Outpatient Therapy Prior Outpatient Therapy: No Prior Therapy Dates: 3 years ago Prior Therapy Facilty/Provider(s): [facility] Reason for Treatment: detox Does patient have an ACCT team?: No Does patient have Intensive In-House Services?  : No Does patient have [facility] services? : No Does patient have P4CC services?: No  ADL Screening (condition at time of admission) Patient's cognitive ability adequate to safely complete daily activities?: Yes Patient able to express need for assistance with ADLs?: Yes Independently performs ADLs?: No       Abuse/Neglect Assessment (Assessment to be complete while patient is alone) Physical Abuse: Denies Verbal Abuse: Denies Sexual Abuse: Denies Exploitation of patient/patient's resources: Denies Self-Neglect: Denies Values / Beliefs Cultural Requests During Hospitalization: None Spiritual Requests During Hospitalization: None Consults Spiritual Care Consult Needed: No Social Work Consult Needed: No      Additional Information 1:1 In Past 12 Months?: No CIRT Risk: No Elopement Risk: No Does patient have medical clearance?: Yes     Disposition:  Disposition Initial Assessment Completed for this Encounter: Yes Disposition of Patient:  Inpatient treatment program, Referred to ([facility]; per patient request) Type of inpatient treatment program: Adult Patient referred to: Other (Comment) ([facility])  On Site Evaluation by:   Reviewed with Physician:    Dwan BoltMargaret  08/04/2014 4:35 AM

## 2014-08-04 NOTE — ED Notes (Signed)
ED BHU PLACEMENT JUSTIFICATION Is the patient under IVC or is there intent for IVC: No. Is the patient medically cleared: Yes.   Is there vacancy in the ED BHU: Yes.   Is the population mix appropriate for patient: No. Is the patient awaiting placement in inpatient or outpatient setting: No. Has the patient had a psychiatric consult: Yes.   Survey of unit performed for contraband, proper placement and condition of furniture, tampering with fixtures in bathroom, shower, and each patient room: Yes.  ; Findings: none APPEARANCE/BEHAVIOR calm NEURO ASSESSMENT Orientation: person Hallucinations: No.None noted (Hallucinations) Speech: Normal Gait: normal RESPIRATORY ASSESSMENT No respiratory distress noted CARDIOVASCULAR ASSESSMENT Skin color appropriate for age and race GASTROINTESTINAL ASSESSMENT no GI distress noted EXTREMITIES Moves all extremities PLAN OF CARE Provide calm/safe environment. Vital signs assessed twice daily. ED BHU Assessment once each 12-hour shift. Collaborate with intake RN daily or as condition indicates. Assure the ED provider has rounded once each shift. Provide and encourage hygiene. Provide redirection as needed. Assess for escalating behavior; address immediately and inform ED provider.  Assess family dynamic and appropriateness for visitation as needed: Yes.   Educate the patient/family about BHU procedures/visitation: Yes.

## 2014-08-04 NOTE — ED Notes (Signed)
Report given to Bill, RN.

## 2014-08-05 ENCOUNTER — Encounter: Payer: Self-pay | Admitting: Emergency Medicine

## 2014-08-05 NOTE — ED Notes (Signed)
Pt given lunch tray.

## 2014-08-05 NOTE — ED Notes (Signed)
BEHAVIORAL HEALTH ROUNDING Patient sleeping: No. Patient alert and oriented: yes Behavior appropriate: Yes.  ; If no, describe:  Nutrition and fluids offered: Yes  Toileting and hygiene offered: Yes  Sitter present: no Law enforcement present: Yes  

## 2014-08-05 NOTE — ED Notes (Signed)
Pt ate 100% of breakfast.

## 2014-08-05 NOTE — ED Notes (Signed)
BEHAVIORAL HEALTH ROUNDING Patient sleeping: Yes.   Patient alert and oriented: not applicable Behavior appropriate: Yes.    Nutrition and fluids offered: No Toileting and hygiene offered: No Sitter present: yes Law enforcement present: Yes ODS 

## 2014-08-05 NOTE — BHH Counselor (Signed)
Followed up with [facility] ([phone]) about the pt. And possible placement. She stated, their facility isn't equipped for the blind.

## 2014-08-05 NOTE — ED Notes (Signed)
Pt resting in bed, lights dimmed. Pt has been ambulatory this AM.

## 2014-08-05 NOTE — ED Provider Notes (Signed)
-----------------------------------------   1:09 PM on 08/05/2014 -----------------------------------------   BP 168/98 mmHg  Pulse 68  Temp(Src) 98 F (36.7 C) (Oral)  Resp 18  Ht 6' (1.829 m)  Wt 200 lb (90.719 kg)  BMI 27.12 kg/m2  SpO2 96%  The patient had no acute events since last update.  Calm and cooperative at this time.  Dr. Toni Amendlapacs saw the patient in the emergency department and feels he is safe for discharge. He does not call 5 for inpatient care at this time, so he should follow-up with [facility]. He was provided with the appropriate resources.    Loleta Roseory , MD 08/05/14 1309

## 2014-08-05 NOTE — ED Provider Notes (Signed)
-----------------------------------------   2:16 AM on 08/05/2014 -----------------------------------------   BP 113/84 mmHg  Pulse 69  Temp(Src) 98.3 F (36.8 C) (Oral)  Resp 20  Ht 6' (1.829 m)  Wt 200 lb (90.719 kg)  BMI 27.12 kg/m2  SpO2 96%  The patient had no acute events overnight.  Calm and cooperative at this time.  Disposition is pending per Psychiatry/Behavioral Medicine team recommendations.     Sharyn CreamerMark , MD 08/05/14 [phone]

## 2014-08-05 NOTE — ED Notes (Signed)
Pt discharged to home, followup with [facility] encouraged. Pt states that his family is coming to pick him up. Pt ambulatory, states that he would like discharge instructions read to him, this nurse did so. States that family can help him read them at home. Denies need for waiting in room until family arrives. Pt wants to wait in waiting room. Alert and oriented X4, active , cooperative and calm. States he will followup with [facility].

## 2014-08-05 NOTE — ED Notes (Signed)
Report given to Ally, RN 

## 2014-08-05 NOTE — ED Notes (Signed)
ED BHU PLACEMENT JUSTIFICATION Is the patient under IVC or is there intent for IVC: No. Is the patient medically cleared: Yes.   Is there vacancy in the ED BHU: Yes.   Is the population mix appropriate for patient: No. Is the patient awaiting placement in inpatient or outpatient setting: Yes.   Has the patient had a psychiatric consult: Yes.   Survey of unit performed for contraband, proper placement and condition of furniture, tampering with fixtures in bathroom, shower, and each patient room: Yes.  ; Findings:  APPEARANCE/BEHAVIOR calm and cooperative NEURO ASSESSMENT Orientation: person Hallucinations: No.None noted (Hallucinations) Speech: Normal Gait: normal RESPIRATORY ASSESSMENT Normal expansion.  Clear to auscultation.  No rales, rhonchi, or wheezing. CARDIOVASCULAR ASSESSMENT regular rate and rhythm, S1, S2 normal, no murmur, click, rub or gallop GASTROINTESTINAL ASSESSMENT soft, nontender, BS WNL, no r/g EXTREMITIES normal strength, tone, and muscle mass PLAN OF CARE Provide calm/safe environment. Vital signs assessed twice daily. ED BHU Assessment once each 12-hour shift. Collaborate with intake RN daily or as condition indicates. Assure the ED provider has rounded once each shift. Provide and encourage hygiene. Provide redirection as needed. Assess for escalating behavior; address immediately and inform ED provider.  Assess family dynamic and appropriateness for visitation as needed: Yes.  ; If necessary, describe findings:  Educate the patient/family about BHU procedures/visitation: Yes.  ; If necessary, describe findings:

## 2014-08-05 NOTE — Discharge Instructions (Signed)
You have been seen in the Emergency Department (ED)  today for psychiatric issues including substance abuse.  You have been evaluated by the behavioral medicine specialists and are being referred to:  [facility] [address] [place], [place] [address] Phone:  [phone] or [phone]  Open Access:   Walk-in ASSESSMENT hours, M-W-F, 8:00am - 3:00pm Advanced Acess CRISIS:  M-F, 8:00am - 8:00pm Outpatient Services Office Hours:  M-F, 8:00am - 5:00pm  Please return to the Emergency Department (ED)  immediately if you have ANY thoughts of hurting yourself or anyone else, so that we may help you.  Please avoid alcohol and drug use.  Follow up with your doctor and/or therapist as soon as possible regarding today's ED  visit.   Please follow up any other recommendations and clinic appointments provided by the psychiatry team that saw you in the Emergency Department.     Polysubstance Abuse When people abuse more than one drug or type of drug it is called polysubstance or polydrug abuse. For example, many smokers also drink alcohol. This is one form of polydrug abuse. Polydrug abuse also refers to the use of a drug to counteract an unpleasant effect produced by another drug. It may also be used to help with withdrawal from another drug. People who take stimulants may become agitated. Sometimes this agitation is countered with a tranquilizer. This helps protect against the unpleasant side effects. Polydrug abuse also refers to the use of different drugs at the same time.  Anytime drug use is interfering with normal living activities, it has become abuse. This includes problems with family and friends. Psychological dependence has developed when your mind tells you that the drug is needed. This is usually followed by physical dependence which has developed when continuing increases of drug are required to get the same feeling or "high". This is known as  addiction or chemical dependency. A person's risk is much higher if there is a history of chemical dependency in the family. SIGNS OF CHEMICAL DEPENDENCY  You have been told by friends or family that drugs have become a problem.  You fight when using drugs.  You are having blackouts (not remembering what you do while using).  You feel sick from using drugs but continue using.  You lie about use or amounts of drugs (chemicals) used.  You need chemicals to get you going.  You are suffering in work performance or in school because of drug use.  You get sick from use of drugs but continue to use anyway.  You need drugs to relate to people or feel comfortable in social situations.  You use drugs to forget problems. "Yes" answered to any of the above signs of chemical dependency indicates there are problems. The longer the use of drugs continues, the greater the problems will become. If there is a family history of drug or alcohol use, it is best not to experiment with these drugs. Continual use leads to tolerance. After tolerance develops more of the drug is needed to get the same feeling. This is followed by addiction. With addiction, drugs become the most important part of life. It becomes more important to take drugs than participate in the other usual activities of life. This includes relating to friends and family. Addiction is followed by dependency. Dependency is a condition where drugs are now needed not just to get high, but to feel normal. Addiction cannot be cured but it can be stopped. This often requires  outside help and the care of professionals. Treatment centers are listed in the yellow pages under: Cocaine, Narcotics, and Alcoholics Anonymous. Most hospitals and clinics can refer you to a specialized care center. Talk to your caregiver if you need help. Document Released: 11/07/2004 Document Revised: 06/10/2011 Document Reviewed: 03/18/2005 [facility] Patient Information 2015  [place], [place]. This information is not intended to replace advice given to you by your health care provider. Make sure you discuss any questions you have with your health care provider.  Alcohol Use Disorder Alcohol use disorder is a mental disorder. It is not a one-time incident of heavy drinking. Alcohol use disorder is the excessive and uncontrollable use of alcohol over time that leads to problems with functioning in one or more areas of daily living. People with this disorder risk harming themselves and others when they drink to excess. Alcohol use disorder also can cause other mental disorders, such as mood and anxiety disorders, and serious physical problems. People with alcohol use disorder often misuse other drugs.  Alcohol use disorder is common and widespread. Some people with this disorder drink alcohol to cope with or escape from negative life events. Others drink to relieve chronic pain or symptoms of mental illness. People with a family history of alcohol use disorder are at higher risk of losing control and using alcohol to excess.  SYMPTOMS  Signs and symptoms of alcohol use disorder may include the following:   Consumption ofalcohol inlarger amounts or over a longer period of time than intended.  Multiple unsuccessful attempts to cutdown or control alcohol use.   A great deal of time spent obtaining alcohol, using alcohol, or recovering from the effects of alcohol (hangover).  A strong desire or urge to use alcohol (cravings).   Continued use of alcohol despite problems at work, school, or home because of alcohol use.   Continued use of alcohol despite problems in relationships because of alcohol use.  Continued use of alcohol in situations when it is physically hazardous, such as driving a car.  Continued use of alcohol despite awareness of a physical or psychological problem that is likely related to alcohol use. Physical problems related to alcohol use can involve the  brain, heart, liver, stomach, and intestines. Psychological problems related to alcohol use include intoxication, depression, anxiety, psychosis, delirium, and dementia.   The need for increased amounts of alcohol to achieve the same desired effect, or a decreased effect from the consumption of the same amount of alcohol (tolerance).  Withdrawal symptoms upon reducing or stopping alcohol use, or alcohol use to reduce or avoid withdrawal symptoms. Withdrawal symptoms include:  Racing heart.  Hand tremor.  Difficulty sleeping.  Nausea.  Vomiting.  Hallucinations.  Restlessness.  Seizures. DIAGNOSIS Alcohol use disorder is diagnosed through an assessment by your health care provider. Your health care provider may start by asking three or four questions to screen for excessive or problematic alcohol use. To confirm a diagnosis of alcohol use disorder, at least two symptoms must be present within a 10-month period. The severity of alcohol use disorder depends on the number of symptoms:  Mild--two or three.  Moderate--four or five.  Severe--six or more. Your health care provider may perform a physical exam or use results from lab tests to see if you have physical problems resulting from alcohol use. Your health care provider may refer you to a mental health professional for evaluation. TREATMENT  Some people with alcohol use disorder are able to reduce their alcohol use to  low-risk levels. Some people with alcohol use disorder need to quit drinking alcohol. When necessary, mental health professionals with specialized training in substance use treatment can help. Your health care provider can help you decide how severe your alcohol use disorder is and what type of treatment you need. The following forms of treatment are available:   Detoxification. Detoxification involves the use of prescription medicines to prevent alcohol withdrawal symptoms in the first week after quitting. This is  important for people with a history of symptoms of withdrawal and for heavy drinkers who are likely to have withdrawal symptoms. Alcohol withdrawal can be dangerous and, in severe cases, cause death. Detoxification is usually provided in a hospital or in-patient substance use treatment facility.  Counseling or talk therapy. Talk therapy is provided by substance use treatment counselors. It addresses the reasons people use alcohol and ways to keep them from drinking again. The goals of talk therapy are to help people with alcohol use disorder find healthy activities and ways to cope with life stress, to identify and avoid triggers for alcohol use, and to handle cravings, which can cause relapse.  Medicines.Different medicines can help treat alcohol use disorder through the following actions:  Decrease alcohol cravings.  Decrease the positive reward response felt from alcohol use.  Produce an uncomfortable physical reaction when alcohol is used (aversion therapy).  Support groups. Support groups are run by people who have quit drinking. They provide emotional support, advice, and guidance. These forms of treatment are often combined. Some people with alcohol use disorder benefit from intensive combination treatment provided by specialized substance use treatment centers. Both inpatient and outpatient treatment programs are available. Document Released: 04/25/2004 Document Revised: 08/02/2013 Document Reviewed: 06/25/2012 [facility] Patient Information 2015 [place], [place]. This information is not intended to replace advice given to you by your health care provider. Make sure you discuss any questions you have with your health care provider.  Alcohol Withdrawal Alcohol withdrawal happens when you normally drink alcohol a lot and suddenly stop drinking. Alcohol withdrawal symptoms can be mild to very bad. Mild withdrawal symptoms can include feeling sick to your stomach (nauseous), headache, or feeling  irritable. Bad withdrawal symptoms can include shakiness, being very nervous (anxious), and not thinking clearly.  HOME CARE  Join an alcohol support group.  Stay away from people or situations that make you want to drink.  Eat a healthy diet. Eat a lot of fresh fruits, vegetables, and lean meats. GET HELP RIGHT AWAY IF:   You become confused. You start to see and hear things that are not really there.  You feel your heart beating very fast.  You throw up (vomit) blood or cannot stop throwing up. This may be bright red or look like black coffee grounds.  You have blood in your poop (stool). This may be bright red, maroon colored, or black and tarry.  You are lightheaded or pass out (faint).  You develop a fever. MAKE SURE YOU:   Understand these instructions.  Will watch your condition.  Will get help right away if you are not doing well or get worse. Document Released: 09/04/2007 Document Revised: 06/10/2011 Document Reviewed: 09/04/2007 [facility] Patient Information 2015 [place], [place]. This information is not intended to replace advice given to you by your health care provider. Make sure you discuss any questions you have with your health care provider.

## 2014-08-05 NOTE — ED Notes (Signed)

## 2014-08-05 NOTE — ED Notes (Signed)
Pt given breakfast tray

## 2015-04-04 ENCOUNTER — Emergency Department
Admission: EM | Admit: 2015-04-04 | Discharge: 2015-04-04 | Disposition: A | Discharge status: Home / Self Care | Payer: Medicaid Other | Attending: Emergency Medicine | Admitting: Emergency Medicine

## 2015-04-04 ENCOUNTER — Encounter: Payer: Self-pay | Admitting: Emergency Medicine

## 2015-04-04 DIAGNOSIS — Z7984 Long term (current) use of oral hypoglycemic drugs: Secondary | ICD-10-CM | POA: Diagnosis not present

## 2015-04-04 DIAGNOSIS — E119 Type 2 diabetes mellitus without complications: Secondary | ICD-10-CM | POA: Insufficient documentation

## 2015-04-04 DIAGNOSIS — I10 Essential (primary) hypertension: Secondary | ICD-10-CM | POA: Insufficient documentation

## 2015-04-04 DIAGNOSIS — F1010 Alcohol abuse, uncomplicated: Secondary | ICD-10-CM | POA: Insufficient documentation

## 2015-04-04 DIAGNOSIS — F1410 Cocaine abuse, uncomplicated: Secondary | ICD-10-CM | POA: Insufficient documentation

## 2015-04-04 DIAGNOSIS — Z79899 Other long term (current) drug therapy: Secondary | ICD-10-CM | POA: Diagnosis not present

## 2015-04-04 DIAGNOSIS — R079 Chest pain, unspecified: Secondary | ICD-10-CM | POA: Insufficient documentation

## 2015-04-04 DIAGNOSIS — F1210 Cannabis abuse, uncomplicated: Secondary | ICD-10-CM | POA: Insufficient documentation

## 2015-04-04 DIAGNOSIS — F17210 Nicotine dependence, cigarettes, uncomplicated: Secondary | ICD-10-CM | POA: Diagnosis not present

## 2015-04-04 DIAGNOSIS — F1910 Other psychoactive substance abuse, uncomplicated: Secondary | ICD-10-CM

## 2015-04-04 LAB — URINALYSIS COMPLETE WITH MICROSCOPIC (ARMC ONLY)
Bacteria, UA: NONE SEEN
Bilirubin Urine: NEGATIVE
GLUCOSE, UA: NEGATIVE mg/dL
HGB URINE DIPSTICK: NEGATIVE
Ketones, ur: NEGATIVE mg/dL
Leukocytes, UA: NEGATIVE
Nitrite: NEGATIVE
Protein, ur: NEGATIVE mg/dL
Specific Gravity, Urine: 1.004 — ABNORMAL LOW (ref 1.005–1.030)
WBC, UA: NONE SEEN WBC/hpf (ref 0–5)
pH: 5 (ref 5.0–8.0)

## 2015-04-04 LAB — CBC
HCT: 47.1 % (ref 40.0–52.0)
Hemoglobin: 15.8 g/dL (ref 13.0–18.0)
MCH: 32.8 pg (ref 26.0–34.0)
MCHC: 33.6 g/dL (ref 32.0–36.0)
MCV: 97.6 fL (ref 80.0–100.0)
PLATELETS: 239 10*3/uL (ref 150–440)
RBC: 4.83 MIL/uL (ref 4.40–5.90)
RDW: 13 % (ref 11.5–14.5)
WBC: 8.2 10*3/uL (ref 3.8–10.6)

## 2015-04-04 LAB — ETHANOL: Alcohol, Ethyl (B): 204 mg/dL — ABNORMAL HIGH (ref <5)

## 2015-04-04 LAB — URINE DRUG SCREEN, QUALITATIVE (ARMC ONLY)
Amphetamines, Ur Screen: NOT DETECTED
BARBITURATES, UR SCREEN: NOT DETECTED
BENZODIAZEPINE, UR SCRN: NOT DETECTED
Cannabinoid 50 Ng, Ur ~~LOC~~: POSITIVE — AB
Cocaine Metabolite,Ur ~~LOC~~: NOT DETECTED
MDMA (Ecstasy)Ur Screen: NOT DETECTED
Methadone Scn, Ur: NOT DETECTED
Opiate, Ur Screen: NOT DETECTED
PHENCYCLIDINE (PCP) UR S: NOT DETECTED
Tricyclic, Ur Screen: NOT DETECTED

## 2015-04-04 LAB — TROPONIN I

## 2015-04-04 LAB — COMPREHENSIVE METABOLIC PANEL
ALBUMIN: 3.9 g/dL (ref 3.5–5.0)
ALT: 24 U/L (ref 17–63)
AST: 27 U/L (ref 15–41)
Alkaline Phosphatase: 52 U/L (ref 38–126)
Anion gap: 9 (ref 5–15)
BUN: 10 mg/dL (ref 6–20)
CALCIUM: 8.8 mg/dL — AB (ref 8.9–10.3)
CO2: 23 mmol/L (ref 22–32)
CREATININE: 1.11 mg/dL (ref 0.61–1.24)
Chloride: 107 mmol/L (ref 101–111)
GFR calc Af Amer: >60 mL/min (ref >60)
GFR calc non Af Amer: >60 mL/min (ref >60)
GLUCOSE: 136 mg/dL — AB (ref 65–99)
POTASSIUM: 3.6 mmol/L (ref 3.5–5.1)
Sodium: 139 mmol/L (ref 135–145)
Total Bilirubin: 0.2 mg/dL — ABNORMAL LOW (ref 0.3–1.2)
Total Protein: 7.1 g/dL (ref 6.5–8.1)

## 2015-04-04 MED ORDER — AMLODIPINE BESYLATE 10 MG PO TABS: 10.0000 mg | ORAL_TABLET | Freq: Every day | ORAL | Status: DC

## 2015-04-04 MED ORDER — METFORMIN HCL 500 MG PO TABS: 500.0000 mg | ORAL_TABLET | Freq: Two times a day (BID) | ORAL | Status: DC

## 2015-04-04 NOTE — Discharge Instructions (Signed)
Substance Abuse Testing WHY AM I HAVING THIS TEST? Substance abuse testing is done to identify the presence of drugs in the body. It may also be done to:  Help guide treatment if you have seizures.  Measure the levels of certain medicines in your body, including anabolic steroids, stimulants, diuretics, and lithium. Substance abuse testing is most often used by employers or Patent examinerlaw enforcement agencies to identify whether a person has used illegal drugs, such as cocaine, amphetamines, and marijuana. WHAT KIND OF SAMPLE IS TAKEN? Your health care provider may collect at least one of the following to perform the test:  Urine. A urine sample is collected in a sterile container given to you by the lab. If you are asked to provide a urine sample, do not alter it in any way.  Hair. A sample of hair requires cutting 50 strands of hair from your scalp.  Blood. A blood sample is usually collected by inserting a needle into a vein. HOW DO I PREPARE FOR THE TEST? You may be asked to provide a list of your current prescription medicines. If the test is required for employment or legal reasons, you will be asked to give consent for the test. There is no other preparation required. HOW ARE THE TEST RESULTS REPORTED? Your test results will be reported as either positive or negative. It may be your responsibility to obtain your test results. Ask the lab or department performing the test when and how you will get your results. WHAT DO THE RESULTS MEAN? A negative result means no drugs were found. A positive result may mean you have recently used drugs. If your result is positive, more testing will be done to confirm the presence of drugs.   This information is not intended to replace advice given to you by your health care provider. Make sure you discuss any questions you have with your health care provider.   Document Released: 03/18/2005 Document Revised: 04/08/2014 Document Reviewed: 08/13/2013 Elsevier  Interactive Patient Education 2016 [employer]  Alcohol Abuse and Nutrition Alcohol abuse is any pattern of alcohol consumption that harms your health, relationships, or work. Alcohol abuse can affect how your body breaks down and absorbs nutrients from food by causing your liver to work abnormally. Additionally, many people who abuse alcohol do not eat enough carbohydrates, protein, fat, vitamins, and minerals. This can cause poor nutrition (malnutrition) and a lack of nutrients (nutrient deficiencies), which can lead to further complications. Nutrients that are commonly lacking (deficient) among people who abuse alcohol include:  Vitamins.  Vitamin A. This is stored in your liver. It is important for your vision, metabolism, and ability to fight off infections (immunity).  B vitamins. These include vitamins such as folate, thiamin, and niacin. These are important in new cell growth and maintenance.  Vitamin C. This plays an important role in iron absorption, wound healing, and immunity.  Vitamin D. This is produced by your liver, but you can also get vitamin D from food. Vitamin D is necessary for your body to absorb and use calcium.  Minerals.  Calcium. This is important for your bones and your heart and blood vessel (cardiovascular) function.  Iron. This is important for blood, muscle, and nervous system functioning.  Magnesium. This plays an important role in muscle and nerve function, and it helps to control blood sugar and blood pressure.  Zinc. This is important for the normal function of your nervous system and digestive system (gastrointestinal tract). Nutrition is an essential component of  therapy for alcohol abuse. Your health care provider or dietitian will work with you to design a plan that can help restore nutrients to your body and prevent potential complications. WHAT IS MY PLAN? Your dietitian may develop a specific diet plan that is based on your condition and any  other complications you may have. A diet plan will commonly include:  A balanced diet.  Grains: 6-8 oz per day.  Vegetables: 2-3 cups per day.  Fruits: 1-2 cups per day.  Meat and other protein: 5-6 oz per day.  Dairy: 2-3 cups per day.  Vitamin and mineral supplements. WHAT DO I NEED TO KNOW ABOUT ALCOHOL AND NUTRITION?  Consume foods that are high in antioxidants, such as grapes, berries, nuts, green tea, and dark green and orange vegetables. This can help to counteract some of the stress that is placed on your liver by consuming alcohol.  Avoid food and drinks that are high in fat and sugar. Foods such as sugared soft drinks, salty snack foods, and candy contain empty calories. This means that they lack important nutrients such as protein, fiber, and vitamins.  Eat frequent meals and snacks. Try to eat 5-6 small meals each day.  Eat a variety of fresh fruits and vegetables each day. This will help you get plenty of water, fiber, and vitamins in your diet.  Drink plenty of water and other clear fluids. Try to drink at least 48-64 oz (1.5-2 L) of water per day.  If you are a vegetarian, eat a variety of protein-rich foods. Pair whole grains with plant-based proteins at meals and snacks to obtain the greatest nutrient benefit from your food. For example, eat rice with beans, put peanut butter on whole-grain toast, or eat oatmeal with sunflower seeds.  Soak beans and whole grains overnight before cooking. This can help your body to absorb the nutrients more easily.  Include foods fortified with vitamins and minerals in your diet. Commonly fortified foods include milk, orange juice, cereal, and bread.  If you are malnourished, your dietitian may recommend a high-protein, high-calorie diet. This may include:  2,000-3,000 calories (kilocalories) per day.  70-100 grams of protein per day.  Your health care provider may recommend a complete nutritional supplement beverage. This can  help to restore calories, protein, and vitamins to your body. Depending on your condition, you may be advised to consume this instead of or in addition to meals.  Limit your intake of caffeine. Replace drinks like coffee and black tea with decaffeinated coffee and herbal tea.  Eat a variety of foods that are high in omega fatty acids. These include fish, nuts and seeds, and soybeans. These foods may help your liver to recover and may also stabilize your mood.  Certain medicines may cause changes in your appetite, taste, and weight. Work with your health care provider and dietitian to make any adjustments to your medicines and diet plan.  Include other healthy lifestyle choices in your daily routine.  Be physically active.  Get enough sleep.  Spend time doing activities that you enjoy.  If you are unable to take in enough food and calories by mouth, your health care provider may recommend a feeding tube. This is a tube that passes through your nose and throat, directly into your stomach. Nutritional supplement beverages can be given to you through the feeding tube to help you get the nutrients you need.  Take vitamin or mineral supplements as recommended by your health care provider. WHAT FOODS CAN I  EAT? Grains Enriched pasta. Enriched rice. Fortified whole-grain bread. Fortified whole-grain cereal. Barley. Brown rice. Quinoa. Millet. Vegetables All fresh, frozen, and canned vegetables. Spinach. Kale. Artichoke. Carrots. Winter squash and pumpkin. Sweet potatoes. Broccoli. Cabbage. Cucumbers. Tomatoes. Sweet peppers. Green beans. Peas. Corn. Fruits All fresh and frozen fruits. Berries. Grapes. Mango. Papaya. Guava. Cherries. Apples. Bananas. Peaches. Plums. Pineapple. Watermelon. Cantaloupe. Oranges. Avocado. Meats and Other Protein Sources Beef liver. Lean beef. Pork. Fresh and canned chicken. Fresh fish. Oysters. Sardines. Canned tuna. Shrimp. Eggs with yolks. Nuts and seeds. Peanut  butter. Beans and lentils. Soybeans. Tofu. Dairy Whole, low-fat, and nonfat milk. Whole, low-fat, and nonfat yogurt. Cottage cheese. Sour cream. Hard and soft cheeses. Beverages Water. Herbal tea. Decaffeinated coffee. Decaffeinated green tea. 100% fruit juice. 100% vegetable juice. Instant breakfast shakes. Condiments Ketchup. Mayonnaise. Mustard. Salad dressing. Barbecue sauce. Sweets and Desserts Sugar-free ice cream. Sugar-free pudding. Sugar-free gelatin. Fats and Oils Butter. Vegetable oil, flaxseed oil, olive oil, and walnut oil. Other Complete nutrition shakes. Protein bars. Sugar-free gum. The items listed above may not be a complete list of recommended foods or beverages. Contact your dietitian for more options. WHAT FOODS ARE NOT RECOMMENDED? Grains Sugar-sweetened breakfast cereals. Flavored instant oatmeal. Fried breads. Vegetables Breaded or deep-fried vegetables. Fruits Dried fruit with added sugar. Candied fruit. Canned fruit in syrup. Meats and Other Protein Sources Breaded or deep-fried meats. Dairy Flavored milks. Fried cheese curds or fried cheese sticks. Beverages Alcohol. Sugar-sweetened soft drinks. Sugar-sweetened tea. Caffeinated coffee and tea. Condiments Sugar. Honey. Agave nectar. Molasses. Sweets and Desserts Chocolate. Cake. Cookies. Candy. Other Potato chips. Pretzels. Salted nuts. Candied nuts. The items listed above may not be a complete list of foods and beverages to avoid. Contact your dietitian for more information.   This information is not intended to replace advice given to you by your health care provider. Make sure you discuss any questions you have with your health care provider.   Document Released: 01/10/2005 Document Revised: 04/08/2014 Document Reviewed: 10/19/2013 Elsevier Interactive Patient Education [employer]
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
## 2015-04-04 NOTE — ED Notes (Signed)

## 2015-04-04 NOTE — ED Notes (Signed)
Pt laying in bed comfortably

## 2015-04-04 NOTE — ED Notes (Signed)

## 2015-04-04 NOTE — BH Specialist Note (Signed)
TTS spoke with Austin Berry at [facility].  Patient is accepted into their program.  Transportation will pick up Austin Berry around 6 a.m.

## 2015-04-04 NOTE — ED Notes (Signed)
Pt in with co heroin abuse wanting detox also co chest pain.
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
## 2015-04-04 NOTE — ED Notes (Signed)
BEHAVIORAL HEALTH ROUNDING Patient sleeping: No. Patient alert and oriented: yes Behavior appropriate: Yes.  ; If no, describe:  Nutrition and fluids offered: Yes  Toileting and hygiene offered: Yes  Sitter present: no Law enforcement present: No 

## 2015-04-04 NOTE — ED Notes (Addendum)
Pt given meal tray, pt to go to [facility] at 0900

## 2015-04-04 NOTE — BH Specialist Note (Signed)
Due to BAL, [facility] will pick up Austin Berry around 9 a.m. To allow for his BAL to fall below 114.

## 2015-04-04 NOTE — ED Notes (Signed)

## 2015-04-17 ENCOUNTER — Encounter (HOSPITAL_COMMUNITY): Payer: Self-pay | Admitting: *Deleted

## 2015-04-17 ENCOUNTER — Emergency Department (HOSPITAL_COMMUNITY)
Admission: EM | Admit: 2015-04-17 | Discharge: 2015-04-17 | Disposition: A | Discharge status: Home / Self Care | Payer: Medicaid Other | Attending: Emergency Medicine | Admitting: Emergency Medicine

## 2015-04-17 DIAGNOSIS — E119 Type 2 diabetes mellitus without complications: Secondary | ICD-10-CM | POA: Diagnosis not present

## 2015-04-17 DIAGNOSIS — Y9289 Other specified places as the place of occurrence of the external cause: Secondary | ICD-10-CM | POA: Insufficient documentation

## 2015-04-17 DIAGNOSIS — Y9301 Activity, walking, marching and hiking: Secondary | ICD-10-CM | POA: Diagnosis not present

## 2015-04-17 DIAGNOSIS — Z7984 Long term (current) use of oral hypoglycemic drugs: Secondary | ICD-10-CM | POA: Diagnosis not present

## 2015-04-17 DIAGNOSIS — H540 Blindness, both eyes: Secondary | ICD-10-CM | POA: Insufficient documentation

## 2015-04-17 DIAGNOSIS — F17210 Nicotine dependence, cigarettes, uncomplicated: Secondary | ICD-10-CM | POA: Insufficient documentation

## 2015-04-17 DIAGNOSIS — Z79899 Other long term (current) drug therapy: Secondary | ICD-10-CM | POA: Diagnosis not present

## 2015-04-17 DIAGNOSIS — S3992XA Unspecified injury of lower back, initial encounter: Secondary | ICD-10-CM | POA: Insufficient documentation

## 2015-04-17 DIAGNOSIS — Y998 Other external cause status: Secondary | ICD-10-CM | POA: Insufficient documentation

## 2015-04-17 DIAGNOSIS — M545 Low back pain: Secondary | ICD-10-CM

## 2015-04-17 DIAGNOSIS — W010XXA Fall on same level from slipping, tripping and stumbling without subsequent striking against object, initial encounter: Secondary | ICD-10-CM | POA: Diagnosis not present

## 2015-04-17 DIAGNOSIS — I10 Essential (primary) hypertension: Secondary | ICD-10-CM | POA: Insufficient documentation
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
## 2015-04-17 NOTE — ED Notes (Signed)
Pt ambulated to bathroom with assistance.
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
## 2015-04-17 NOTE — Discharge Instructions (Signed)

## 2015-04-17 NOTE — ED Notes (Signed)
Pt made aware to return if symptoms worsen or if any life threatening symptoms occur.   

## 2015-05-23 ENCOUNTER — Emergency Department: Payer: Medicaid Other

## 2015-05-23 ENCOUNTER — Emergency Department
Admission: EM | Admit: 2015-05-23 | Discharge: 2015-05-23 | Disposition: A | Discharge status: Home / Self Care | Payer: Medicaid Other | Attending: Emergency Medicine | Admitting: Emergency Medicine

## 2015-05-23 ENCOUNTER — Encounter: Payer: Self-pay | Admitting: Emergency Medicine

## 2015-05-23 DIAGNOSIS — E119 Type 2 diabetes mellitus without complications: Secondary | ICD-10-CM | POA: Diagnosis not present

## 2015-05-23 DIAGNOSIS — Y9289 Other specified places as the place of occurrence of the external cause: Secondary | ICD-10-CM | POA: Diagnosis not present

## 2015-05-23 DIAGNOSIS — Y998 Other external cause status: Secondary | ICD-10-CM | POA: Insufficient documentation

## 2015-05-23 DIAGNOSIS — F17210 Nicotine dependence, cigarettes, uncomplicated: Secondary | ICD-10-CM | POA: Diagnosis not present

## 2015-05-23 DIAGNOSIS — Y9389 Activity, other specified: Secondary | ICD-10-CM | POA: Diagnosis not present

## 2015-05-23 DIAGNOSIS — W1839XA Other fall on same level, initial encounter: Secondary | ICD-10-CM | POA: Diagnosis not present

## 2015-05-23 DIAGNOSIS — S2232XA Fracture of one rib, left side, initial encounter for closed fracture: Secondary | ICD-10-CM | POA: Insufficient documentation

## 2015-05-23 DIAGNOSIS — Z7984 Long term (current) use of oral hypoglycemic drugs: Secondary | ICD-10-CM | POA: Insufficient documentation

## 2015-05-23 DIAGNOSIS — I10 Essential (primary) hypertension: Secondary | ICD-10-CM | POA: Insufficient documentation

## 2015-05-23 DIAGNOSIS — Z79899 Other long term (current) drug therapy: Secondary | ICD-10-CM | POA: Diagnosis not present

## 2015-05-23 DIAGNOSIS — S29001A Unspecified injury of muscle and tendon of front wall of thorax, initial encounter: Secondary | ICD-10-CM | POA: Diagnosis present

## 2015-05-23 MED ORDER — NAPROXEN 500 MG PO TABS
500.0000 mg | ORAL_TABLET | Freq: Once | ORAL | Status: AC
  Administered 2015-05-23: 500 mg via ORAL
  Filled 2015-05-23: qty 1

## 2015-05-23 MED ORDER — OXYCODONE-ACETAMINOPHEN 7.5-325 MG PO TABS: 1.0000 | ORAL_TABLET | Freq: Four times a day (QID) | ORAL | Status: DC | PRN

## 2015-05-23 MED ORDER — NAPROXEN 500 MG PO TABS: 500.0000 mg | ORAL_TABLET | Freq: Two times a day (BID) | ORAL | Status: AC

## 2015-05-23 MED ORDER — OXYCODONE-ACETAMINOPHEN 5-325 MG PO TABS
1.0000 | ORAL_TABLET | Freq: Once | ORAL | Status: AC
  Administered 2015-05-23: 1 via ORAL
  Filled 2015-05-23: qty 1

## 2015-05-23 NOTE — ED Notes (Signed)
Fell backwards onto porch landing on back, c/o left posterior rib pain, worse with inspiration.  Lung sounds are diminished but even on both sides.  Not hypoxic.  VSS
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
## 2015-05-23 NOTE — ED Notes (Signed)
States he fell backwards   Hit left rib area   Having increased pain with inspiration or movement

## 2015-05-23 NOTE — Discharge Instructions (Signed)
Rib Fracture °A rib fracture is a break or crack in one of the bones of the ribs. The ribs are like a cage that goes around your upper chest. A broken or cracked rib is often painful, but most do not cause other problems. Most rib fractures heal on their own in 1-3 months. °HOME CARE °· Avoid activities that cause pain to the injured area. Protect your injured area. °· Slowly increase activity as told by your doctor. °· Take medicine as told by your doctor. °· Put ice on the injured area for the first 1-2 days after you have been treated or as told by your doctor. °¨ Put ice in a plastic bag. °¨ Place a towel between your skin and the bag. °¨ Leave the ice on for 15-20 minutes at a time, every 2 hours while you are awake. °· Do deep breathing as told by your doctor. You may be told to: °¨ Take deep breaths many times a day. °¨ Cough many times a day while hugging a pillow. °¨ Use a device (incentive spirometer) to perform deep breathing many times a day. °· Drink enough fluids to keep your pee (urine) clear or pale yellow.   °· Do not wear a rib belt or binder. These do not allow you to breathe deeply. °GET HELP RIGHT AWAY IF:  °· You have a fever. °· You have trouble breathing.   °· You cannot stop coughing. °· You cough up thick or bloody spit (mucus).   °· You feel sick to your stomach (nauseous), throw up (vomit), or have belly (abdominal) pain.   °· Your pain gets worse and medicine does not help.   °MAKE SURE YOU:  °· Understand these instructions. °· Will watch your condition. °· Will get help right away if you are not doing well or get worse. °  °This information is not intended to replace advice given to you by your health care provider. Make sure you discuss any questions you have with your health care provider. °  °Document Released: 12/26/2007 Document Revised: 07/13/2012 Document Reviewed: 05/20/2012 °Elsevier Interactive Patient Education ©2016 [employer] ° °

## 2015-08-14 ENCOUNTER — Emergency Department
Admission: EM | Admit: 2015-08-14 | Discharge: 2015-08-14 | Disposition: A | Discharge status: Home / Self Care | Payer: Medicaid Other | Attending: Emergency Medicine | Admitting: Emergency Medicine

## 2015-08-14 ENCOUNTER — Emergency Department: Payer: Medicaid Other

## 2015-08-14 DIAGNOSIS — Y999 Unspecified external cause status: Secondary | ICD-10-CM | POA: Insufficient documentation

## 2015-08-14 DIAGNOSIS — Y9389 Activity, other specified: Secondary | ICD-10-CM | POA: Insufficient documentation

## 2015-08-14 DIAGNOSIS — Z7984 Long term (current) use of oral hypoglycemic drugs: Secondary | ICD-10-CM | POA: Diagnosis not present

## 2015-08-14 DIAGNOSIS — S63501A Unspecified sprain of right wrist, initial encounter: Secondary | ICD-10-CM | POA: Diagnosis not present

## 2015-08-14 DIAGNOSIS — E119 Type 2 diabetes mellitus without complications: Secondary | ICD-10-CM | POA: Diagnosis not present

## 2015-08-14 DIAGNOSIS — I10 Essential (primary) hypertension: Secondary | ICD-10-CM | POA: Diagnosis not present

## 2015-08-14 DIAGNOSIS — W1839XA Other fall on same level, initial encounter: Secondary | ICD-10-CM | POA: Diagnosis not present

## 2015-08-14 DIAGNOSIS — F1290 Cannabis use, unspecified, uncomplicated: Secondary | ICD-10-CM | POA: Insufficient documentation

## 2015-08-14 DIAGNOSIS — F1490 Cocaine use, unspecified, uncomplicated: Secondary | ICD-10-CM | POA: Diagnosis not present

## 2015-08-14 DIAGNOSIS — Y929 Unspecified place or not applicable: Secondary | ICD-10-CM | POA: Insufficient documentation

## 2015-08-14 DIAGNOSIS — S60911A Unspecified superficial injury of right wrist, initial encounter: Secondary | ICD-10-CM | POA: Diagnosis present

## 2015-08-14 DIAGNOSIS — Z79899 Other long term (current) drug therapy: Secondary | ICD-10-CM | POA: Insufficient documentation

## 2015-08-14 DIAGNOSIS — F17210 Nicotine dependence, cigarettes, uncomplicated: Secondary | ICD-10-CM | POA: Diagnosis not present

## 2015-08-14 MED ORDER — OXYCODONE-ACETAMINOPHEN 5-325 MG PO TABS: 1.0000 | ORAL_TABLET | Freq: Four times a day (QID) | ORAL | Status: DC | PRN

## 2015-08-14 MED ORDER — OXYCODONE-ACETAMINOPHEN 5-325 MG PO TABS
1.0000 | ORAL_TABLET | Freq: Once | ORAL | Status: AC
  Administered 2015-08-14: 1 via ORAL
  Filled 2015-08-14: qty 1

## 2015-08-14 NOTE — Discharge Instructions (Signed)
Wear wrist support for 3-5 days as needed. °
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
## 2015-08-14 NOTE — ED Notes (Signed)
Pt states he tried to sit in a wheelchair on Saturday and missed the wheelchair, catching himself with his right wrist and hand. Pt c/o right wrist and hand pain.

## 2015-08-14 NOTE — ED Notes (Signed)
Pt presents to ED with right wrist pain. Pt states he went to sit down in his friends wheel chair and missed catching himself on the floor with his right hand 2 days ago. C/o increasing pain since onset of injury. Hand wrapped in ace bandage.

## 2016-03-20 DIAGNOSIS — H401113 Primary open-angle glaucoma, right eye, severe stage: Secondary | ICD-10-CM | POA: Insufficient documentation

## 2016-03-20 DIAGNOSIS — H44512 Absolute glaucoma, left eye: Secondary | ICD-10-CM | POA: Insufficient documentation

## 2017-01-08 ENCOUNTER — Telehealth: Payer: Self-pay | Admitting: Gastroenterology

## 2017-01-08 NOTE — Telephone Encounter (Signed)
Patient ready to schedule colon. Referral in epic.

## 2017-01-10 ENCOUNTER — Telehealth: Payer: Self-pay

## 2017-01-10 ENCOUNTER — Other Ambulatory Visit: Payer: Self-pay

## 2017-01-10 DIAGNOSIS — Z1211 Encounter for screening for malignant neoplasm of colon: Secondary | ICD-10-CM

## 2017-01-10 NOTE — Telephone Encounter (Signed)
Gastroenterology Pre-Procedure Review  Request Date: 01/30/17 Requesting Physician: Dr. Servando Snare  PATIENT REVIEW QUESTIONS: The patient responded to the following health history questions as indicated:    1. Are you having any GI issues? no 2. Do you have a personal history of Polyps? no 3. Do you have a family history of Colon Cancer or Polyps? no 4. Diabetes Mellitus? yes 5. Joint replacements in the past 12 months?no 6. Major health problems in the past 3 months?no 7. Any artificial heart valves, MVP, or defibrillator?no    MEDICATIONS & ALLERGIES:    Patient reports the following regarding taking any anticoagulation/antiplatelet therapy:   Plavix, Coumadin, Eliquis, Xarelto, Lovenox, Pradaxa, Brilinta, or Effient? no Aspirin? no  Patient confirms/reports the following medications:  Current Outpatient Prescriptions  Medication Sig Dispense Refill  . acetaminophen (TYLENOL) 325 MG tablet Take 650 mg by mouth every 6 (six) hours as needed for mild pain or headache.    Marland Kitchen amLODipine (NORVASC) 10 MG tablet Take 1 tablet (10 mg total) by mouth daily. 30 tablet 0  . atorvastatin (LIPITOR) 40 MG tablet Take 40 mg by mouth daily.    . dorzolamide (TRUSOPT) 2 % ophthalmic solution Place 1 drop into both eyes daily.    Marland Kitchen ibuprofen (ADVIL,MOTRIN) 600 MG tablet Take 600 mg by mouth every 6 (six) hours as needed for fever or moderate pain.    Marland Kitchen LISINOPRIL PO Take 1 tablet by mouth daily.     . metFORMIN (GLUCOPHAGE) 500 MG tablet Take 1 tablet (500 mg total) by mouth 2 (two) times daily with a meal. 60 tablet 0  . Omeprazole (PRILOSEC PO) Take by mouth.    . oxyCODONE-acetaminophen (PERCOCET) 7.5-325 MG tablet Take 1 tablet by mouth every 6 (six) hours as needed for severe pain. 12 tablet 0  . oxyCODONE-acetaminophen (ROXICET) 5-325 MG tablet Take 1 tablet by mouth every 6 (six) hours as needed for moderate pain. 12 tablet 0  . ranitidine (ZANTAC) 150 MG capsule Take 150 mg by mouth 2 (two) times  daily.    . sertraline (ZOLOFT) 50 MG tablet Take 50 mg by mouth daily.     No current facility-administered medications for this visit.     Patient confirms/reports the following allergies:  Allergies  Allergen Reactions  . Tramadol Itching    No orders of the defined types were placed in this encounter.   AUTHORIZATION INFORMATION Primary Insurance: 1D#: Group #:  Secondary Insurance: 1D#: Group #:  SCHEDULE INFORMATION: Date: 01/30/17 Time: Location:[facility]

## 2017-01-20 ENCOUNTER — Telehealth: Payer: Self-pay | Admitting: Gastroenterology

## 2017-01-20 NOTE — Telephone Encounter (Signed)
Patient left a voice message that he needs to postpone his procedure. He didn't state if he wanted to reschedule or not. Please call

## 2017-01-29 NOTE — Telephone Encounter (Signed)
LVM for pt to return my call.

## 2017-01-30 ENCOUNTER — Ambulatory Visit: Admission: RE | Admit: 2017-01-30 | Payer: Medicaid Other | Source: Ambulatory Visit | Admitting: Gastroenterology

## 2017-01-30 ENCOUNTER — Encounter: Admission: RE | Payer: Self-pay | Source: Ambulatory Visit

## 2017-01-30 SURGERY — COLONOSCOPY WITH PROPOFOL
Anesthesia: General

## 2018-03-18 ENCOUNTER — Encounter: Payer: Self-pay | Admitting: Emergency Medicine

## 2018-03-18 ENCOUNTER — Emergency Department: Payer: Medicaid Other

## 2018-03-18 ENCOUNTER — Emergency Department
Admission: EM | Admit: 2018-03-18 | Discharge: 2018-03-18 | Disposition: A | Discharge status: Home / Self Care | Payer: Medicaid Other | Attending: Emergency Medicine | Admitting: Emergency Medicine

## 2018-03-18 ENCOUNTER — Other Ambulatory Visit: Payer: Self-pay

## 2018-03-18 DIAGNOSIS — F1410 Cocaine abuse, uncomplicated: Secondary | ICD-10-CM | POA: Insufficient documentation

## 2018-03-18 DIAGNOSIS — Z7984 Long term (current) use of oral hypoglycemic drugs: Secondary | ICD-10-CM | POA: Insufficient documentation

## 2018-03-18 DIAGNOSIS — X58XXXA Exposure to other specified factors, initial encounter: Secondary | ICD-10-CM | POA: Diagnosis not present

## 2018-03-18 DIAGNOSIS — E119 Type 2 diabetes mellitus without complications: Secondary | ICD-10-CM | POA: Diagnosis not present

## 2018-03-18 DIAGNOSIS — Z79899 Other long term (current) drug therapy: Secondary | ICD-10-CM | POA: Diagnosis not present

## 2018-03-18 DIAGNOSIS — Y929 Unspecified place or not applicable: Secondary | ICD-10-CM | POA: Diagnosis not present

## 2018-03-18 DIAGNOSIS — F17210 Nicotine dependence, cigarettes, uncomplicated: Secondary | ICD-10-CM | POA: Diagnosis not present

## 2018-03-18 DIAGNOSIS — L03213 Periorbital cellulitis: Secondary | ICD-10-CM

## 2018-03-18 DIAGNOSIS — Y939 Activity, unspecified: Secondary | ICD-10-CM | POA: Insufficient documentation

## 2018-03-18 DIAGNOSIS — S0501XA Injury of conjunctiva and corneal abrasion without foreign body, right eye, initial encounter: Secondary | ICD-10-CM | POA: Diagnosis not present

## 2018-03-18 DIAGNOSIS — F1210 Cannabis abuse, uncomplicated: Secondary | ICD-10-CM | POA: Insufficient documentation

## 2018-03-18 DIAGNOSIS — I10 Essential (primary) hypertension: Secondary | ICD-10-CM | POA: Diagnosis not present

## 2018-03-18 DIAGNOSIS — S0591XA Unspecified injury of right eye and orbit, initial encounter: Secondary | ICD-10-CM | POA: Diagnosis present

## 2018-03-18 DIAGNOSIS — Y998 Other external cause status: Secondary | ICD-10-CM | POA: Insufficient documentation

## 2018-03-18 DIAGNOSIS — R51 Headache: Secondary | ICD-10-CM | POA: Insufficient documentation

## 2018-03-18 LAB — CBC WITH DIFFERENTIAL/PLATELET
ABS IMMATURE GRANULOCYTES: 0.05 10*3/uL (ref 0.00–0.07)
BASOS PCT: 1 %
Basophils Absolute: 0.1 10*3/uL (ref 0.0–0.1)
EOS ABS: 0.1 10*3/uL (ref 0.0–0.5)
Eosinophils Relative: 2 %
HCT: 44.6 % (ref 39.0–52.0)
Hemoglobin: 15.2 g/dL (ref 13.0–17.0)
Immature Granulocytes: 1 %
Lymphocytes Relative: 13 %
Lymphs Abs: 0.9 10*3/uL (ref 0.7–4.0)
MCH: 33.4 pg (ref 26.0–34.0)
MCHC: 34.1 g/dL (ref 30.0–36.0)
MCV: 98 fL (ref 80.0–100.0)
MONO ABS: 1 10*3/uL (ref 0.1–1.0)
Monocytes Relative: 14 %
NEUTROS ABS: 4.9 10*3/uL (ref 1.7–7.7)
NEUTROS PCT: 69 %
Platelets: 288 10*3/uL (ref 150–400)
RBC: 4.55 MIL/uL (ref 4.22–5.81)
RDW: 12.5 % (ref 11.5–15.5)
WBC: 7.1 10*3/uL (ref 4.0–10.5)
nRBC: 0 % (ref 0.0–0.2)

## 2018-03-18 LAB — BASIC METABOLIC PANEL
Anion gap: 6 (ref 5–15)
BUN: 14 mg/dL (ref 6–20)
CO2: 27 mmol/L (ref 22–32)
CREATININE: 1.11 mg/dL (ref 0.61–1.24)
Calcium: 9.2 mg/dL (ref 8.9–10.3)
Chloride: 104 mmol/L (ref 98–111)
GFR calc Af Amer: >60 mL/min (ref >60)
GFR calc non Af Amer: >60 mL/min (ref >60)
Glucose, Bld: 144 mg/dL — ABNORMAL HIGH (ref 70–99)
POTASSIUM: 4.1 mmol/L (ref 3.5–5.1)
SODIUM: 137 mmol/L (ref 135–145)

## 2018-03-18 MED ORDER — TETRACAINE HCL 0.5 % OP SOLN
2.0000 [drp] | Freq: Once | OPHTHALMIC | Status: AC
  Administered 2018-03-18: 2 [drp] via OPHTHALMIC
  Filled 2018-03-18: qty 4

## 2018-03-18 MED ORDER — IOHEXOL 300 MG/ML  SOLN
75.0000 mL | Freq: Once | INTRAMUSCULAR | Status: AC | PRN
  Administered 2018-03-18: 75 mL via INTRAVENOUS

## 2018-03-18 MED ORDER — CLINDAMYCIN HCL 300 MG PO CAPS: 300.0000 mg | ORAL_CAPSULE | Freq: Four times a day (QID) | ORAL | 0 refills | Status: DC

## 2018-03-18 MED ORDER — POLYMYXIN B-TRIMETHOPRIM 10000-0.1 UNIT/ML-% OP SOLN
2.0000 [drp] | OPHTHALMIC | Status: DC
  Administered 2018-03-18: 2 [drp] via OPHTHALMIC
  Filled 2018-03-18: qty 10

## 2018-03-18 MED ORDER — CLINDAMYCIN PHOSPHATE 600 MG/50ML IV SOLN
600.0000 mg | Freq: Once | INTRAVENOUS | Status: AC
  Administered 2018-03-18: 600 mg via INTRAVENOUS
  Filled 2018-03-18 (×2): qty 50

## 2018-03-18 MED ORDER — FLUORESCEIN SODIUM 1 MG OP STRP
1.0000 | ORAL_STRIP | Freq: Once | OPHTHALMIC | Status: AC
  Administered 2018-03-18: 1 via OPHTHALMIC
  Filled 2018-03-18: qty 1

## 2018-03-18 MED ORDER — POLYMYXIN B-TRIMETHOPRIM 10000-0.1 UNIT/ML-% OP SOLN: 1.0000 [drp] | Freq: Four times a day (QID) | OPHTHALMIC | 0 refills | Status: DC

## 2018-03-18 MED ORDER — MORPHINE SULFATE (PF) 4 MG/ML IV SOLN
4.0000 mg | Freq: Once | INTRAVENOUS | Status: AC
  Administered 2018-03-18: 4 mg via INTRAVENOUS
  Filled 2018-03-18: qty 1

## 2018-03-18 NOTE — ED Notes (Signed)
ED Provider at bedside. 

## 2018-03-18 NOTE — Discharge Instructions (Signed)
You have a scratch on your eye that got infected.   Take clindamycin four times daily for a week   Use polytrim eye drops 4 times daily for a week   [facility]   Return to ER if you have fever, worse eye redness and swelling, trouble opening your eye, worse blurry vision

## 2018-03-18 NOTE — ED Triage Notes (Signed)
PT arrived via ems from home with complaints of right sided headache that started last night at 2300. PT has prosthetic left eye and has 20/400 vision on pt's right eye. Face symmetrical, grips equal and strong. Pt has irritation to right eye. 

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
## 2018-05-28 ENCOUNTER — Other Ambulatory Visit: Payer: Self-pay

## 2018-05-28 ENCOUNTER — Emergency Department
Admission: EM | Admit: 2018-05-28 | Discharge: 2018-05-28 | Discharge status: Against Medical Advice | Payer: Medicaid Other | Attending: Emergency Medicine | Admitting: Emergency Medicine

## 2018-05-28 ENCOUNTER — Encounter: Payer: Self-pay | Admitting: Emergency Medicine

## 2018-05-28 DIAGNOSIS — F17210 Nicotine dependence, cigarettes, uncomplicated: Secondary | ICD-10-CM | POA: Diagnosis not present

## 2018-05-28 DIAGNOSIS — E119 Type 2 diabetes mellitus without complications: Secondary | ICD-10-CM | POA: Insufficient documentation

## 2018-05-28 DIAGNOSIS — I10 Essential (primary) hypertension: Secondary | ICD-10-CM | POA: Diagnosis not present

## 2018-05-28 DIAGNOSIS — Z79899 Other long term (current) drug therapy: Secondary | ICD-10-CM | POA: Diagnosis not present

## 2018-05-28 DIAGNOSIS — R197 Diarrhea, unspecified: Secondary | ICD-10-CM | POA: Insufficient documentation

## 2018-05-28 LAB — URINALYSIS, COMPLETE (UACMP) WITH MICROSCOPIC
BACTERIA UA: NONE SEEN
Bilirubin Urine: NEGATIVE
GLUCOSE, UA: NEGATIVE mg/dL
Hgb urine dipstick: NEGATIVE
Ketones, ur: NEGATIVE mg/dL
Leukocytes,Ua: NEGATIVE
Nitrite: NEGATIVE
PH: 5 (ref 5.0–8.0)
Protein, ur: NEGATIVE mg/dL
SPECIFIC GRAVITY, URINE: 1.019 (ref 1.005–1.030)

## 2018-05-28 LAB — COMPREHENSIVE METABOLIC PANEL
ALBUMIN: 4.4 g/dL (ref 3.5–5.0)
ALK PHOS: 51 U/L (ref 38–126)
ALT: 23 U/L (ref 0–44)
ANION GAP: 8 (ref 5–15)
AST: 27 U/L (ref 15–41)
BILIRUBIN TOTAL: 0.6 mg/dL (ref 0.3–1.2)
BUN: 11 mg/dL (ref 6–20)
CALCIUM: 8.8 mg/dL — AB (ref 8.9–10.3)
CO2: 22 mmol/L (ref 22–32)
CREATININE: 1.02 mg/dL (ref 0.61–1.24)
Chloride: 109 mmol/L (ref 98–111)
GFR calc non Af Amer: >60 mL/min (ref >60)
GLUCOSE: 114 mg/dL — AB (ref 70–99)
Potassium: 3.5 mmol/L (ref 3.5–5.1)
SODIUM: 139 mmol/L (ref 135–145)
TOTAL PROTEIN: 7.6 g/dL (ref 6.5–8.1)

## 2018-05-28 LAB — CBC
HCT: 45.2 % (ref 39.0–52.0)
Hemoglobin: 15.4 g/dL (ref 13.0–17.0)
MCH: 32.8 pg (ref 26.0–34.0)
MCHC: 34.1 g/dL (ref 30.0–36.0)
MCV: 96.4 fL (ref 80.0–100.0)
PLATELETS: 257 10*3/uL (ref 150–400)
RBC: 4.69 MIL/uL (ref 4.22–5.81)
RDW: 12 % (ref 11.5–15.5)
WBC: 6.4 10*3/uL (ref 4.0–10.5)
nRBC: 0 % (ref 0.0–0.2)

## 2018-05-28 LAB — LIPASE, BLOOD: Lipase: 33 U/L (ref 11–51)

## 2018-05-28 NOTE — ED Triage Notes (Signed)
First Nurse Note:  Patient presents to the ED via EMS for diarrhea x 5 weeks.

## 2018-05-28 NOTE — ED Notes (Signed)
Pt did not want to wait for stool sample. Morrie Sheldon notified.

## 2018-05-28 NOTE — ED Triage Notes (Addendum)
Says he has diarrhea for 2 weeks.  Says he was taking laxative pills up until  Yesterday.  He is legally blind.  Has [facility] for assistance at home.

## 2018-05-28 NOTE — ED Notes (Signed)
Pt left without discharge papers. 
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
## 2018-05-28 NOTE — ED Notes (Signed)
Left without papers/signature

## 2018-05-28 NOTE — ED Notes (Signed)
Pt presents with watery stool x 2 - 3 weeks. Pt states he only has any kind of bowel movement when he drinks and it is "brown water." Denies any blood in stool. Denies any cramping, but states that his abdomen is "tight." Pt alert & oriented with NAD noted.

## 2018-09-29 ENCOUNTER — Encounter: Payer: Self-pay | Admitting: *Deleted

## 2018-10-12 ENCOUNTER — Ambulatory Visit (INDEPENDENT_AMBULATORY_CARE_PROVIDER_SITE_OTHER): Payer: Medicaid Other | Admitting: Gastroenterology

## 2018-10-12 ENCOUNTER — Other Ambulatory Visit: Payer: Self-pay

## 2018-10-12 DIAGNOSIS — Z1211 Encounter for screening for malignant neoplasm of colon: Secondary | ICD-10-CM

## 2018-10-13 ENCOUNTER — Encounter: Payer: Self-pay | Admitting: Gastroenterology

## 2018-10-13 ENCOUNTER — Other Ambulatory Visit: Payer: Self-pay

## 2018-10-13 ENCOUNTER — Ambulatory Visit (INDEPENDENT_AMBULATORY_CARE_PROVIDER_SITE_OTHER): Payer: Medicaid Other | Admitting: Gastroenterology

## 2018-10-13 VITALS — BP 144/95 | HR 82 | Temp 99.1°F | Ht 72.0 in | Wt 209.6 lb

## 2018-10-13 DIAGNOSIS — R194 Change in bowel habit: Secondary | ICD-10-CM | POA: Diagnosis not present

## 2018-10-13 DIAGNOSIS — K5900 Constipation, unspecified: Secondary | ICD-10-CM | POA: Diagnosis not present

## 2018-10-14 ENCOUNTER — Telehealth: Payer: Self-pay

## 2018-10-14 NOTE — Telephone Encounter (Signed)
LVM with patients caregiver Bertell Maria) to call office to schedule colonoscopy with Dr. Bonna Gains.  He will need the two day colon prep.  This is a screening colonoscopy, the dates available at [facility] are 07/22 and 07/29 which are on Wednesdays.  Thanks [employer]
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
## 2018-10-19 ENCOUNTER — Other Ambulatory Visit: Payer: Self-pay

## 2018-10-19 ENCOUNTER — Ambulatory Visit: Payer: Medicaid Other | Admitting: Gastroenterology

## 2018-10-19 DIAGNOSIS — Z1211 Encounter for screening for malignant neoplasm of colon: Secondary | ICD-10-CM

## 2018-10-19 MED ORDER — NA SULFATE-K SULFATE-MG SULF 17.5-3.13-1.6 GM/177ML PO SOLN
1.0000 | Freq: Once | ORAL | 0 refills | Status: AC
Start: 2018-10-19 — End: 2018-10-19

## 2018-10-23 ENCOUNTER — Other Ambulatory Visit: Payer: Medicaid Other

## 2018-10-26 ENCOUNTER — Encounter
Admission: RE | Admit: 2018-10-26 | Discharge: 2018-10-26 | Disposition: A | Discharge status: Home / Self Care | Payer: Medicaid Other | Source: Ambulatory Visit | Attending: Gastroenterology | Admitting: Gastroenterology

## 2018-10-26 ENCOUNTER — Other Ambulatory Visit: Payer: Self-pay

## 2018-10-26 DIAGNOSIS — Z1159 Encounter for screening for other viral diseases: Secondary | ICD-10-CM | POA: Diagnosis present

## 2018-10-26 LAB — SARS CORONAVIRUS 2 (TAT 6-24 HRS): SARS Coronavirus 2: NEGATIVE

## 2018-10-27 ENCOUNTER — Encounter: Payer: Self-pay | Admitting: *Deleted

## 2018-10-28 ENCOUNTER — Ambulatory Visit
Admission: RE | Admit: 2018-10-28 | Discharge: 2018-10-28 | Disposition: A | Discharge status: Home / Self Care | Payer: Medicaid Other | Attending: Gastroenterology | Admitting: Gastroenterology

## 2018-10-28 ENCOUNTER — Ambulatory Visit: Payer: Medicaid Other | Admitting: Anesthesiology

## 2018-10-28 ENCOUNTER — Other Ambulatory Visit: Payer: Self-pay

## 2018-10-28 ENCOUNTER — Encounter
Admission: RE | Disposition: A | Discharge status: Home / Self Care | Payer: Self-pay | Source: Home / Self Care | Attending: Gastroenterology

## 2018-10-28 ENCOUNTER — Encounter: Payer: Self-pay | Admitting: *Deleted

## 2018-10-28 DIAGNOSIS — K649 Unspecified hemorrhoids: Secondary | ICD-10-CM | POA: Diagnosis not present

## 2018-10-28 DIAGNOSIS — K648 Other hemorrhoids: Secondary | ICD-10-CM | POA: Diagnosis not present

## 2018-10-28 DIAGNOSIS — H547 Unspecified visual loss: Secondary | ICD-10-CM | POA: Diagnosis not present

## 2018-10-28 DIAGNOSIS — I10 Essential (primary) hypertension: Secondary | ICD-10-CM | POA: Diagnosis not present

## 2018-10-28 DIAGNOSIS — K5730 Diverticulosis of large intestine without perforation or abscess without bleeding: Secondary | ICD-10-CM | POA: Diagnosis not present

## 2018-10-28 DIAGNOSIS — Z79899 Other long term (current) drug therapy: Secondary | ICD-10-CM | POA: Diagnosis not present

## 2018-10-28 DIAGNOSIS — E119 Type 2 diabetes mellitus without complications: Secondary | ICD-10-CM | POA: Insufficient documentation

## 2018-10-28 DIAGNOSIS — Z7984 Long term (current) use of oral hypoglycemic drugs: Secondary | ICD-10-CM | POA: Insufficient documentation

## 2018-10-28 DIAGNOSIS — F17210 Nicotine dependence, cigarettes, uncomplicated: Secondary | ICD-10-CM | POA: Diagnosis not present

## 2018-10-28 DIAGNOSIS — F419 Anxiety disorder, unspecified: Secondary | ICD-10-CM | POA: Insufficient documentation

## 2018-10-28 DIAGNOSIS — Z1211 Encounter for screening for malignant neoplasm of colon: Secondary | ICD-10-CM | POA: Diagnosis not present

## 2018-10-28 HISTORY — PX: COLONOSCOPY WITH PROPOFOL: SHX5780

## 2018-10-28 LAB — URINE DRUG SCREEN, QUALITATIVE (ARMC ONLY)
Amphetamines, Ur Screen: NOT DETECTED
Barbiturates, Ur Screen: NOT DETECTED
Benzodiazepine, Ur Scrn: POSITIVE — AB
Cannabinoid 50 Ng, Ur ~~LOC~~: POSITIVE — AB
Cocaine Metabolite,Ur ~~LOC~~: NOT DETECTED
MDMA (Ecstasy)Ur Screen: NOT DETECTED
Methadone Scn, Ur: NOT DETECTED
Opiate, Ur Screen: NOT DETECTED
Phencyclidine (PCP) Ur S: NOT DETECTED
Tricyclic, Ur Screen: NOT DETECTED

## 2018-10-28 LAB — GLUCOSE, CAPILLARY: Glucose-Capillary: 89 mg/dL (ref 70–99)

## 2018-10-28 SURGERY — COLONOSCOPY WITH PROPOFOL
Anesthesia: General

## 2018-10-28 MED ORDER — SODIUM CHLORIDE 0.9 % IV SOLN
INTRAVENOUS | Status: DC
  Administered 2018-10-28: 09:00:00 via INTRAVENOUS

## 2018-10-28 MED ORDER — PROPOFOL 500 MG/50ML IV EMUL
INTRAVENOUS | Status: AC
  Filled 2018-10-28: qty 50

## 2018-10-28 MED ORDER — PROPOFOL 10 MG/ML IV BOLUS
INTRAVENOUS | Status: DC | PRN
  Administered 2018-10-28: 23 mg via INTRAVENOUS
  Administered 2018-10-28: 80 mg via INTRAVENOUS

## 2018-10-28 MED ORDER — MIDAZOLAM HCL 2 MG/2ML IJ SOLN
INTRAMUSCULAR | Status: AC
  Filled 2018-10-28: qty 2

## 2018-10-28 MED ORDER — MIDAZOLAM HCL 2 MG/2ML IJ SOLN
INTRAMUSCULAR | Status: DC | PRN
  Administered 2018-10-28: 2 mg via INTRAVENOUS

## 2018-10-28 MED ORDER — LIDOCAINE HCL (PF) 2 % IJ SOLN
INTRAMUSCULAR | Status: AC
  Filled 2018-10-28: qty 10

## 2018-10-28 MED ORDER — LIDOCAINE HCL (CARDIAC) PF 100 MG/5ML IV SOSY
PREFILLED_SYRINGE | INTRAVENOUS | Status: DC | PRN
  Administered 2018-10-28: 50 mg via INTRAVENOUS

## 2018-10-28 MED ORDER — PROPOFOL 500 MG/50ML IV EMUL
INTRAVENOUS | Status: DC | PRN
  Administered 2018-10-28: 130 ug/kg/min via INTRAVENOUS

## 2018-10-28 NOTE — Anesthesia Post-op Follow-up Note (Signed)
Anesthesia QCDR form completed.        
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
## 2018-10-28 NOTE — Anesthesia Postprocedure Evaluation (Signed)
Anesthesia Post Note  Patient: [name]  Procedure(s) Performed: COLONOSCOPY WITH PROPOFOL (N/A )  Patient location during evaluation: Endoscopy Anesthesia Type: General Level of consciousness: awake and alert Pain management: pain level controlled Vital Signs Assessment: post-procedure vital signs reviewed and stable Respiratory status: spontaneous breathing, nonlabored ventilation, respiratory function stable and patient connected to nasal cannula oxygen Cardiovascular status: blood pressure returned to baseline and stable Postop Assessment: no apparent nausea or vomiting Anesthetic complications: no     Last Vitals:  Vitals:   10/28/18 0939 10/28/18 0943  BP:  (!) 128/92  Pulse: 93 88  Resp: 18 15  Temp:    SpO2: 97% 96%    Last Pain:  Vitals:   10/28/18 0943  TempSrc:   PainSc: 0-No pain                 Martha Clan

## 2018-10-28 NOTE — Op Note (Signed)
[facility] Gastroenterology Patient Name: Gilmer MorJohn Matteo Procedure Date: 10/28/2018 8:24 AM MRN: [id number] Account #: [id number] Date of Birth: March 21, 1966 Admit Type: Outpatient Age: 53 [facility] 2 Gender: Male Note Status: Finalized Procedure:            Colonoscopy Indications:          Screening for colorectal malignant neoplasm Providers:             B. Maximino Greenlandahiliani MD, MD Medicines:            Monitored Anesthesia Care Complications:        No immediate complications. Procedure:            Pre-Anesthesia Assessment:                       - Prior to the procedure, a History and Physical was                        performed, and patient medications, allergies and                        sensitivities were reviewed. The patient's tolerance of                        previous anesthesia was reviewed.                       - The risks and benefits of the procedure and the                        sedation options and risks were discussed with the                        patient. All questions were answered and informed                        consent was obtained.                       - Patient identification and proposed procedure were                        verified prior to the procedure by the physician, the                        nurse, the anesthetist and the technician. The                        procedure was verified in the pre-procedure area in the                        procedure room in the endoscopy suite.                       - ASA Grade Assessment: II - A patient with mild                        systemic disease.                       - After reviewing the risks and benefits, the patient  was deemed in satisfactory condition to undergo the                        procedure.                       After obtaining informed consent, the colonoscope was                        passed under direct vision. Throughout the  procedure,                        the patient's blood pressure, pulse, and oxygen                        saturations were monitored continuously. The                        Colonoscope was introduced through the anus and                        advanced to the the cecum, identified by appendiceal                        orifice and ileocecal valve. The colonoscopy was                        performed with ease. The patient tolerated the                        procedure well. The quality of the bowel preparation                        was fair. Findings:      The perianal and digital rectal examinations were normal.      Multiple diverticula were found in the sigmoid colon.      The exam was otherwise without abnormality.      The rectum, sigmoid colon, descending colon, transverse colon, ascending       colon and cecum appeared normal.      Non-bleeding internal hemorrhoids were found during retroflexion. Impression:           - Preparation of the colon was fair.                       - Diverticulosis in the sigmoid colon.                       - The examination was otherwise normal.                       - The rectum, sigmoid colon, descending colon,                        transverse colon, ascending colon and cecum are normal.                       - Non-bleeding internal hemorrhoids.                       - No specimens collected. Recommendation:       - Discharge patient to home.                       -  Resume previous diet.                       - Continue present medications.                       - Repeat colonoscopy in 1 year, with 2 day prep.                       - Return to primary care physician as previously                        scheduled.                       - The findings and recommendations were discussed with                        the patient.                       - The findings and recommendations were discussed with                        the patient's family.                        - High fiber diet. Procedure Code(s):    --- Professional ---                       [id number], Colonoscopy, flexible; diagnostic, including                        collection of specimen(s) by brushing or washing, when                        performed (separate procedure) Diagnosis Code(s):    --- Professional ---                       Z12.11, Encounter for screening for malignant neoplasm                        of colon                       K64.8, Other hemorrhoids                       K57.30, Diverticulosis of large intestine without                        perforation or abscess without bleeding CPT copyright 2019 American Medical Association. All rights reserved. The codes documented in this report are preliminary and upon coder review may  be revised to meet current compliance requirements.  [name], MD Margretta Sidle B. Bonna Gains MD, MD 10/28/2018 9:17:54 AM This report has been signed electronically. Number of Addenda: 0 Note Initiated On: 10/28/2018 8:24 AM Scope Withdrawal Time: 0 hours 23 minutes 34 seconds  Total Procedure Duration: 0 hours 29 minutes 30 seconds  Estimated Blood Loss: Estimated blood loss: none.      [facility]

## 2018-10-28 NOTE — Transfer of Care (Addendum)
Immediate Anesthesia Transfer of Care Note  Patient: Austin Berry  Procedure(s) Performed: COLONOSCOPY WITH PROPOFOL (N/A )  Patient Location: PACU and Endoscopy Unit  Anesthesia Type:General  Level of Consciousness: drowsy  Airway & Oxygen Therapy: Patient Spontanous Breathing and Patient connected to nasal cannula oxygen  Post-op Assessment: Report given to RN and Post -op Vital signs reviewed and stable  Post vital signs: Reviewed and stable  Last Vitals: see vital sign flow sheet Vitals Value Taken Time  BP 135/93   Temp    Pulse 110 10/28/18 0917  Resp 12 10/28/18 0917  SpO2 90 % 10/28/18 0917  Vitals shown include unvalidated device data.  Last Pain:  Vitals:   10/28/18 0726  TempSrc: Tympanic         Complications: No apparent anesthesia complications

## 2018-10-28 NOTE — Progress Notes (Signed)
Awaiting UDS results due to remote cocaine use history
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
## 2018-10-29 ENCOUNTER — Encounter: Payer: Self-pay | Admitting: Gastroenterology

## 2018-11-10 ENCOUNTER — Encounter: Payer: Self-pay | Admitting: Gastroenterology

## 2018-11-10 ENCOUNTER — Ambulatory Visit (INDEPENDENT_AMBULATORY_CARE_PROVIDER_SITE_OTHER): Payer: Medicaid Other | Admitting: Gastroenterology

## 2018-11-10 ENCOUNTER — Other Ambulatory Visit: Payer: Self-pay

## 2018-11-10 VITALS — BP 146/87 | HR 78 | Temp 97.2°F | Ht 72.0 in | Wt 195.6 lb

## 2018-11-10 DIAGNOSIS — K219 Gastro-esophageal reflux disease without esophagitis: Secondary | ICD-10-CM | POA: Diagnosis not present

## 2018-11-10 DIAGNOSIS — R140 Abdominal distension (gaseous): Secondary | ICD-10-CM | POA: Diagnosis not present

## 2018-11-10 DIAGNOSIS — R109 Unspecified abdominal pain: Secondary | ICD-10-CM | POA: Diagnosis not present

## 2018-11-10 MED ORDER — OMEPRAZOLE 20 MG PO CPDR: 20.0000 mg | DELAYED_RELEASE_CAPSULE | Freq: Every day | ORAL | 1 refills | Status: DC

## 2018-11-10 NOTE — Progress Notes (Signed)
[name], MD [address]  [place]  [place], [place] [address]  Main: [phone]  Fax: [phone]   Primary Care Physician: Freddy Finner, NP   Chief Complaint  Patient presents with  . Follow-up    constipation, diverticulosis    HPI: Austin Berry is a 53 y.o. male previously referred for constipation which had resolved with GaviLAX that he was taking at the time and reporting one regular bowel movement daily with it.  Patient is legally blind but caregiver present and denies that patient has any blood in stool.  Since his colonoscopy he has not been taking GaviLAX and reports 1 small bowel movement daily but states that it is watery.  However, sometimes it is formed.  Reports very good appetite.  No nausea or vomiting.  No dysphagia.  Does report heartburn.  Was previously on omeprazole but has not been given this per months and reports daily heartburn symptoms.  Colonoscopy in July 2020, done for screening showed diverticulosis, nonbleeding internal hemorrhoids.  Otherwise normal.  Poor prep.  Repeat colonoscopy recommended in 1 year with 2-day prep.  Current Outpatient Medications  Medication Sig Dispense Refill  . acetaminophen (TYLENOL) 325 MG tablet Take 650 mg by mouth every 6 (six) hours as needed for mild pain or headache.    Marland Kitchen amLODipine (NORVASC) 10 MG tablet Take 1 tablet (10 mg total) by mouth daily. 30 tablet 0  . amLODipine-benazepril (LOTREL) 10-20 MG capsule TAKE 1 CAPSULE BY MOUTH DAILY FOR HIGH BLOOD PRESSURE    . atorvastatin (LIPITOR) 40 MG tablet Take 40 mg by mouth daily.    . brimonidine (ALPHAGAN) 0.2 % ophthalmic solution 3 (three) times daily.    . clindamycin (CLEOCIN) 300 MG capsule Take 1 capsule (300 mg total) by mouth 4 (four) times daily. X 7 days (Patient not taking: Reported on 10/13/2018) 28 capsule 0  . dorzolamide (TRUSOPT) 2 % ophthalmic solution Place 1 drop into both eyes daily.    Marland Kitchen GAVILAX 17 GM/SCOOP powder PLEASE  SEE ATTACHED FOR DETAILED DIRECTIONS    . ibuprofen (ADVIL,MOTRIN) 600 MG tablet Take 600 mg by mouth every 6 (six) hours as needed for fever or moderate pain.    Marland Kitchen LISINOPRIL PO Take 1 tablet by mouth daily.     . metFORMIN (GLUCOPHAGE) 500 MG tablet Take 1 tablet (500 mg total) by mouth 2 (two) times daily with a meal. 60 tablet 0  . naproxen (NAPROSYN) 500 MG tablet TAKE 1 TAB BY MOUTH TWICE DAILY AS NEEDED FOR PAIN X 2 4 WEEKS    . omeprazole (PRILOSEC) 20 MG capsule Take 1 capsule (20 mg total) by mouth daily. 30 capsule 1  . oxyCODONE-acetaminophen (PERCOCET) 7.5-325 MG tablet Take 1 tablet by mouth every 6 (six) hours as needed for severe pain. (Patient not taking: Reported on 10/13/2018) 12 tablet 0  . oxyCODONE-acetaminophen (ROXICET) 5-325 MG tablet Take 1 tablet by mouth every 6 (six) hours as needed for moderate pain. (Patient not taking: Reported on 10/13/2018) 12 tablet 0  . ranitidine (ZANTAC) 150 MG capsule Take 150 mg by mouth 2 (two) times daily.    . sertraline (ZOLOFT) 50 MG tablet Take 50 mg by mouth daily.    Marland Kitchen trimethoprim-polymyxin b (POLYTRIM) ophthalmic solution Place 1 drop into the right eye every 6 (six) hours. 10 mL 0   No current facility-administered medications for this visit.     Allergies as of 11/10/2018 - Review Complete 10/28/2018  Allergen  Reaction Noted  . Tramadol Itching 04/17/2015    ROS:  General: Negative for anorexia, weight loss, fever, chills, fatigue, weakness. ENT: Negative for hoarseness, difficulty swallowing , nasal congestion. CV: Negative for chest pain, angina, palpitations, dyspnea on exertion, peripheral edema.  Respiratory: Negative for dyspnea at rest, dyspnea on exertion, cough, sputum, wheezing.  GI: See history of present illness. GU:  Negative for dysuria, hematuria, urinary incontinence, urinary frequency, nocturnal urination.  Endo: Negative for unusual weight change.    Physical Examination:   There were no vitals taken  for this visit.  General: Well-nourished, well-developed in no acute distress.  Eyes: No icterus. Conjunctivae pink. Mouth: Oropharyngeal mucosa moist and pink , no lesions erythema or exudate. Neck: Supple, Trachea midline Abdomen: Bowel sounds are normal, nontender, nondistended, no hepatosplenomegaly or masses, no abdominal bruits or hernia , no rebound or guarding.   Extremities: No lower extremity edema. No clubbing or deformities. Neuro: Alert and oriented x 3.  Grossly intact. Skin: Warm and dry, no jaundice.   Psych: Alert and cooperative, normal mood and affect.   Labs: CMP     Component Value Date/Time   NA 139 05/28/2018 1206   NA 138 06/08/2012 0407   K 3.5 05/28/2018 1206   K 3.9 06/08/2012 0407   CL 109 05/28/2018 1206   CL 103 06/08/2012 0407   CO2 22 05/28/2018 1206   CO2 30 06/08/2012 0407   GLUCOSE 114 (H) 05/28/2018 1206   GLUCOSE 269 (H) 06/08/2012 0407   BUN 11 05/28/2018 1206   BUN 13 06/08/2012 0407   CREATININE 1.02 05/28/2018 1206   CREATININE 1.12 06/08/2012 0407   CALCIUM 8.8 (L) 05/28/2018 1206   CALCIUM 8.9 06/08/2012 0407   PROT 7.6 05/28/2018 1206   PROT 8.4 (H) 06/08/2012 0407   ALBUMIN 4.4 05/28/2018 1206   ALBUMIN 3.6 06/08/2012 0407   AST 27 05/28/2018 1206   AST 33 06/08/2012 0407   ALT 23 05/28/2018 1206   ALT 34 06/08/2012 0407   ALKPHOS 51 05/28/2018 1206   ALKPHOS 101 06/08/2012 0407   BILITOT 0.6 05/28/2018 1206   BILITOT 0.1 (L) 06/08/2012 0407   GFRNONAA >60 05/28/2018 1206   GFRNONAA >60 06/08/2012 0407   GFRAA >60 05/28/2018 1206   GFRAA >60 06/08/2012 0407   Lab Results  Component Value Date   WBC 6.4 05/28/2018   HGB 15.4 05/28/2018   HCT 45.2 05/28/2018   MCV 96.4 05/28/2018   PLT 257 05/28/2018    Imaging Studies: No results found.  Assessment and Plan:   Simonne MaffucciJohn A Berman is a 53 y.o. y/o male previously with constipation, now reporting 1 watery bowel movement today, and also reporting heartburn with  epigastric discomfort    Patient does not have any more constipation, but reports watery stool.  Colonoscopy has been reassuring  We will start patient on Metamucil to help bulk stool  Repeat colonoscopy indicated in 1 year from last procedure with 2-day prep  Patient also reporting reflux symptoms daily.  We discussed starting him on Pepcid to avoid adverse effects associated with PPI.  However, patient and family insist on restarting omeprazole as they state it helped him immensely in the past and they do not want to try other medications.  I extensively discussed that Pepcid can relieve his symptoms with lower risk of adverse effects, but they are insistent on using omeprazole instead.  (Risks of PPI use were discussed with patient including bone loss, C. Diff diarrhea, pneumonia, infections,  CKD, electrolyte abnormalities.  Pt. Verbalizes understanding and chooses to continue the medication.)  Low-dose omeprazole sent to pharmacy  We will order H. pylori breath test.  Appropriate time to do this is now given that he has been off omeprazole for at least 2 to 3 months.  Patient was advised to get the test done prior to starting his PPI and he verbalized understanding.  Follow-up in 3 months to reassess symptoms     Dr Melodie BouillonVarnita

## 2018-11-18 LAB — H. PYLORI BREATH TEST: H pylori Breath Test: POSITIVE — AB

## 2018-11-20 MED ORDER — OMEPRAZOLE 20 MG PO CPDR: 20.0000 mg | DELAYED_RELEASE_CAPSULE | Freq: Two times a day (BID) | ORAL | 0 refills | Status: DC

## 2018-11-20 MED ORDER — AMOXICILLIN 500 MG PO TABS: 1000.0000 mg | ORAL_TABLET | Freq: Two times a day (BID) | ORAL | 0 refills | Status: AC

## 2018-11-20 MED ORDER — CLARITHROMYCIN 250 MG PO TABS: 500.0000 mg | ORAL_TABLET | Freq: Two times a day (BID) | ORAL | 0 refills | Status: AC

## 2018-11-20 NOTE — Addendum Note (Signed)
Addended by: [name] on: 11/20/2018 09:19 AM   Modules accepted: Orders

## 2018-11-25 ENCOUNTER — Telehealth: Payer: Self-pay

## 2018-11-25 NOTE — Telephone Encounter (Signed)
-----   Message from [name], MD sent at 11/20/2018  9:20 AM EDT -----  can you please call this patient and let him know that his H. pylori test was positive.  I have sent medications to the pharmacy.  He will take amoxicillin, clarithromycin and omeprazole twice a day as prescribed.  Please ask him to stop his atorvastatin for the 14 days that he is taking these medications as it interacts with these medications.  Once he has finished these medications, he can go back on the atorvastatin.

## 2018-11-25 NOTE — Telephone Encounter (Signed)
Contacted pt again today and he was notified of h pylori results.

## 2019-01-14 ENCOUNTER — Encounter: Payer: Self-pay | Admitting: *Deleted

## 2019-02-02 ENCOUNTER — Other Ambulatory Visit: Payer: Self-pay | Admitting: Gastroenterology

## 2019-02-15 ENCOUNTER — Ambulatory Visit: Payer: Medicaid Other | Admitting: Gastroenterology

## 2019-02-23 ENCOUNTER — Ambulatory Visit: Payer: Medicaid Other | Admitting: Gastroenterology

## 2019-02-23 ENCOUNTER — Other Ambulatory Visit: Payer: Self-pay

## 2019-02-23 ENCOUNTER — Encounter: Payer: Self-pay | Admitting: Gastroenterology

## 2019-02-23 ENCOUNTER — Ambulatory Visit (INDEPENDENT_AMBULATORY_CARE_PROVIDER_SITE_OTHER): Payer: Medicaid Other | Admitting: Gastroenterology

## 2019-02-23 VITALS — BP 140/100 | HR 79 | Temp 97.2°F | Wt 198.0 lb

## 2019-02-23 DIAGNOSIS — A048 Other specified bacterial intestinal infections: Secondary | ICD-10-CM | POA: Diagnosis not present

## 2019-02-23 DIAGNOSIS — K219 Gastro-esophageal reflux disease without esophagitis: Secondary | ICD-10-CM

## 2019-02-23 MED ORDER — FAMOTIDINE 20 MG PO TABS: 20.0000 mg | ORAL_TABLET | Freq: Every day | ORAL | 0 refills | Status: DC

## 2019-02-24 NOTE — Progress Notes (Signed)
Austin Antigua, MD [address]  [place]  [place], [place] [address]  Main: [phone]  Fax: [phone]   Primary Care Physician: Austin Finner, NP   Chief Complaint  Patient presents with  . Gastroesophageal Reflux    Patient states symptoms are no changed.     HPI: Austin Berry is a 53 y.o. male here for follow-up of reflux.  Ran out of his omeprazole about a month ago and since then has had daily heartburn.  Is having to take Tums daily.  No dysphagia.  While on the medication he is asymptomatic.  No weight loss.No changes in bowel habits.  Was H. pylori positive in August 2020 and underwent triple therapy  Constipation well controlled with high-fiber diet  Caregiver present at bedside, and has not noticed any blood in stool.  Colonoscopy in July 2020, done for screening showed diverticulosis, nonbleeding internal hemorrhoids.  Otherwise normal.  Poor prep.  Repeat colonoscopy recommended in 1 year with 2-day prep.  Current Outpatient Medications  Medication Sig Dispense Refill  . acetaminophen (TYLENOL) 325 MG tablet Take 650 mg by mouth every 6 (six) hours as needed for mild pain or headache.    Marland Kitchen amLODipine (NORVASC) 10 MG tablet Take 1 tablet (10 mg total) by mouth daily. 30 tablet 0  . atorvastatin (LIPITOR) 40 MG tablet Take 40 mg by mouth daily.    . brimonidine (ALPHAGAN) 0.2 % ophthalmic solution 3 (three) times daily.    . dorzolamide (TRUSOPT) 2 % ophthalmic solution Place 1 drop into both eyes daily.    . dorzolamide-timolol (COSOPT) 22.3-6.8 MG/ML ophthalmic solution INSTILL 1 DROP IN RIGHT EYE 2 TIMES DAILY    . latanoprost (XALATAN) 0.005 % ophthalmic solution Apply to eye.    Marland Kitchen LISINOPRIL PO Take 1 tablet by mouth daily.     . metFORMIN (GLUCOPHAGE) 500 MG tablet Take 1 tablet (500 mg total) by mouth 2 (two) times daily with a meal. 60 tablet 0  . naproxen (NAPROSYN) 500 MG tablet TAKE 1 TAB BY MOUTH TWICE DAILY AS NEEDED FOR PAIN X 2 4  WEEKS    . psyllium (METAMUCIL) 58.6 % packet Take 1 packet by mouth daily.    . sertraline (ZOLOFT) 50 MG tablet Take 50 mg by mouth daily.    Marland Kitchen zolpidem (AMBIEN) 5 MG tablet Take 5 mg by mouth at bedtime.    . famotidine (PEPCID) 20 MG tablet Take 1 tablet (20 mg total) by mouth daily. 90 tablet 0   No current facility-administered medications for this visit.     Allergies as of 02/23/2019 - Review Complete 02/23/2019  Allergen Reaction Noted  . Tramadol Itching 04/17/2015    ROS:  General: Negative for anorexia, weight loss, fever, chills, fatigue, weakness. ENT: Negative for hoarseness, difficulty swallowing , nasal congestion. CV: Negative for chest pain, angina, palpitations, dyspnea on exertion, peripheral edema.  Respiratory: Negative for dyspnea at rest, dyspnea on exertion, cough, sputum, wheezing.  GI: See history of present illness. GU:  Negative for dysuria, hematuria, urinary incontinence, urinary frequency, nocturnal urination.  Endo: Negative for unusual weight change.    Physical Examination:   BP (!) 140/100 (BP Location: Left Arm, Patient Position: Sitting, Cuff Size: Normal)   Pulse 79   Temp (!) 97.2 F (36.2 C) (Tympanic)   Wt 198 lb (89.8 kg)   BMI 26.85 kg/m   General: Well-nourished, well-developed in no acute distress.  Eyes: No icterus. Conjunctivae pink. Mouth: Oropharyngeal mucosa  moist and pink , no lesions erythema or exudate. Neck: Supple, Trachea midline Abdomen: Bowel sounds are normal, nontender, nondistended, no hepatosplenomegaly or masses, no abdominal bruits or hernia , no rebound or guarding.   Extremities: No lower extremity edema. No clubbing or deformities. Neuro: Alert and oriented x 3.  Grossly intact. Skin: Warm and dry, no jaundice.   Psych: Alert and cooperative, normal mood and affect.   Labs: CMP     Component Value Date/Time   NA 139 05/28/2018 1206   NA 138 06/08/2012 0407   K 3.5 05/28/2018 1206   K 3.9  06/08/2012 0407   CL 109 05/28/2018 1206   CL 103 06/08/2012 0407   CO2 22 05/28/2018 1206   CO2 30 06/08/2012 0407   GLUCOSE 114 (H) 05/28/2018 1206   GLUCOSE 269 (H) 06/08/2012 0407   BUN 11 05/28/2018 1206   BUN 13 06/08/2012 0407   CREATININE 1.02 05/28/2018 1206   CREATININE 1.12 06/08/2012 0407   CALCIUM 8.8 (L) 05/28/2018 1206   CALCIUM 8.9 06/08/2012 0407   PROT 7.6 05/28/2018 1206   PROT 8.4 (H) 06/08/2012 0407   ALBUMIN 4.4 05/28/2018 1206   ALBUMIN 3.6 06/08/2012 0407   AST 27 05/28/2018 1206   AST 33 06/08/2012 0407   ALT 23 05/28/2018 1206   ALT 34 06/08/2012 0407   ALKPHOS 51 05/28/2018 1206   ALKPHOS 101 06/08/2012 0407   BILITOT 0.6 05/28/2018 1206   BILITOT 0.1 (L) 06/08/2012 0407   GFRNONAA >60 05/28/2018 1206   GFRNONAA >60 06/08/2012 0407   GFRAA >60 05/28/2018 1206   GFRAA >60 06/08/2012 0407   Lab Results  Component Value Date   WBC 6.4 05/28/2018   HGB 15.4 05/28/2018   HCT 45.2 05/28/2018   MCV 96.4 05/28/2018   PLT 257 05/28/2018    Imaging Studies: No results found.  Assessment and Plan:   Austin Berry is a 53 y.o. y/o male here for follow-up of GERD  Will need H. pylori eradication testing and this has been ordered.  I have asked him to get this done today, and then start his Pepcid  He is willing to try Pepcid instead of omeprazole due to lower risk of side effects  Patient educated extensively on acid reflux lifestyle modification, including buying a bed wedge, not eating 3 hrs before bedtime, diet modifications, and handout given for the same.   However, and his caregiver would like to get back on the omeprazole quickly if the Pepcid does not help.  I extensively discussed risks of PPI use long-term and they verbalized understanding and still would like to continue the medication despite the known risks. (Risks of PPI use were discussed with patient including bone loss, C. Diff diarrhea, pneumonia, infections, CKD, electrolyte  abnormalities. Pt. Verbalizes understanding and chooses to continue the medication.)     Dr Austin Berry

## 2019-02-25 LAB — H. PYLORI BREATH TEST: H pylori Breath Test: NEGATIVE

## 2019-03-09 ENCOUNTER — Other Ambulatory Visit: Payer: Self-pay

## 2019-03-09 ENCOUNTER — Ambulatory Visit
Admission: EM | Admit: 2019-03-09 | Discharge: 2019-03-09 | Disposition: A | Discharge status: Home / Self Care | Payer: Medicaid Other | Attending: Emergency Medicine | Admitting: Emergency Medicine

## 2019-03-09 DIAGNOSIS — Z20828 Contact with and (suspected) exposure to other viral communicable diseases: Secondary | ICD-10-CM | POA: Diagnosis not present

## 2019-03-09 DIAGNOSIS — Z20822 Contact with and (suspected) exposure to covid-19: Secondary | ICD-10-CM

## 2019-03-09 DIAGNOSIS — R030 Elevated blood-pressure reading, without diagnosis of hypertension: Secondary | ICD-10-CM
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
## 2019-03-09 NOTE — ED Triage Notes (Signed)
Pt here for covid test after positive exposure 2 days ago  

## 2019-03-09 NOTE — Discharge Instructions (Addendum)
COVID testing ordered.  It will take between 5-7 days for test results.  Someone will contact you regarding abnormal results.   ° °In the meantime: °You should remain isolated in your home for 10 days from symptom onset AND greater than 72 hours after symptoms resolution (absence of fever without the use of fever-reducing medication and improvement in respiratory symptoms), whichever is longer °OR 14 days from exposure °Get plenty of rest and push fluids °Use OTC zyrtec for nasal congestion, runny nose, and/or sore throat °Use OTC flonase for nasal congestion and runny nose °Use medications daily for symptom relief °Use OTC medications like ibuprofen or tylenol as needed fever or pain °Follow up with PCP regarding test results °Call or go to the ED if you have any new or worsening symptoms such as fever, cough, shortness of breath, chest tightness, chest pain, turning blue, changes in mental status, etc...  ° °Blood pressure elevated in office.  Please recheck in 24 hours.  If it continues to be greater than 140/90 please follow up with PCP for further evaluation and management.   °

## 2019-03-10 LAB — NOVEL CORONAVIRUS, NAA: SARS-CoV-2, NAA: NOT DETECTED

## 2019-03-19 ENCOUNTER — Telehealth (HOSPITAL_COMMUNITY): Payer: Self-pay | Admitting: Emergency Medicine

## 2019-03-19 NOTE — Telephone Encounter (Signed)
Pt called requesting covid test results. Results reported as negative. Requesting his friend "Kennyth Lose" pick up his test results. Results printed with note saying its okay to give to "jackie".

## 2019-04-13 ENCOUNTER — Telehealth: Payer: Self-pay | Admitting: Gastroenterology

## 2019-04-13 NOTE — Telephone Encounter (Signed)
Last office visit 02/23/2019 H pylori Infection  Last refill omeprazole 20mg  11/10/2018 1 refills  Patient is currently taking Pepcid 20mg  was switch on 02/23/2019. Tried to call patient and informed patient of this information but voicemail is not set up

## 2019-04-13 NOTE — Telephone Encounter (Signed)
Pt needs refill on generic for Prilosec 20 mg send to [facility] in [place]  cb [phone]

## 2019-06-02 ENCOUNTER — Other Ambulatory Visit: Payer: Self-pay

## 2019-06-02 MED ORDER — FAMOTIDINE 20 MG PO TABS: 20.0000 mg | ORAL_TABLET | Freq: Every day | ORAL | 0 refills | Status: DC

## 2019-06-02 NOTE — Telephone Encounter (Signed)
Last office visit 02/23/2019 H pylori infection  Last refill 02/23/2019 0 refills 90 tablets  No appointment is scheduled

## 2019-07-08 ENCOUNTER — Other Ambulatory Visit: Payer: Self-pay | Admitting: Gastroenterology

## 2019-09-30 ENCOUNTER — Other Ambulatory Visit: Payer: Self-pay | Admitting: Gastroenterology

## 2019-10-26 ENCOUNTER — Other Ambulatory Visit: Payer: Self-pay | Admitting: Gastroenterology

## 2019-11-17 ENCOUNTER — Other Ambulatory Visit: Payer: Self-pay | Admitting: Gastroenterology

## 2019-12-06 ENCOUNTER — Other Ambulatory Visit: Payer: Self-pay | Admitting: Gastroenterology

## 2020-02-08 DIAGNOSIS — H02233 Paralytic lagophthalmos right eye, unspecified eyelid: Secondary | ICD-10-CM | POA: Insufficient documentation

## 2020-02-08 DIAGNOSIS — H10432 Chronic follicular conjunctivitis, left eye: Secondary | ICD-10-CM | POA: Insufficient documentation

## 2020-02-08 DIAGNOSIS — H16211 Exposure keratoconjunctivitis, right eye: Secondary | ICD-10-CM | POA: Insufficient documentation

## 2020-02-08 DIAGNOSIS — H02135 Senile ectropion of left lower eyelid: Secondary | ICD-10-CM | POA: Insufficient documentation

## 2020-02-11 ENCOUNTER — Other Ambulatory Visit: Payer: Self-pay | Admitting: Primary Care

## 2020-02-11 DIAGNOSIS — R7401 Elevation of levels of liver transaminase levels: Secondary | ICD-10-CM

## 2020-02-18 ENCOUNTER — Other Ambulatory Visit: Payer: Self-pay

## 2020-02-18 ENCOUNTER — Ambulatory Visit
Admission: RE | Admit: 2020-02-18 | Discharge: 2020-02-18 | Disposition: A | Discharge status: Home / Self Care | Payer: Medicaid Other | Source: Ambulatory Visit | Attending: Primary Care | Admitting: Primary Care

## 2020-02-18 DIAGNOSIS — R7401 Elevation of levels of liver transaminase levels: Secondary | ICD-10-CM | POA: Insufficient documentation

## 2020-02-18 DIAGNOSIS — R7402 Elevation of levels of lactic acid dehydrogenase (LDH): Secondary | ICD-10-CM | POA: Insufficient documentation

## 2020-04-07 ENCOUNTER — Other Ambulatory Visit: Payer: Self-pay

## 2020-05-08 ENCOUNTER — Inpatient Hospital Stay: Payer: Medicaid Other

## 2020-05-08 ENCOUNTER — Inpatient Hospital Stay: Payer: Medicaid Other | Attending: Internal Medicine | Admitting: Internal Medicine
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
## 2020-05-23 ENCOUNTER — Telehealth: Payer: Self-pay | Admitting: *Deleted

## 2020-05-23 DIAGNOSIS — Z87891 Personal history of nicotine dependence: Secondary | ICD-10-CM

## 2020-05-23 DIAGNOSIS — Z122 Encounter for screening for malignant neoplasm of respiratory organs: Secondary | ICD-10-CM

## 2020-05-23 DIAGNOSIS — F17200 Nicotine dependence, unspecified, uncomplicated: Secondary | ICD-10-CM

## 2020-05-23 NOTE — Telephone Encounter (Signed)
Received referral for initial lung cancer screening scan. Contacted patient and obtained smoking history,(current, 30 pack year) as well as answering questions related to screening process. Patient denies signs of lung cancer such as weight loss or hemoptysis. Patient denies comorbidity that would prevent curative treatment if lung cancer were found. Patient is scheduled for shared decision making visit and CT scan on 06/07/20 at 1030am.

## 2020-06-07 ENCOUNTER — Inpatient Hospital Stay (HOSPITAL_BASED_OUTPATIENT_CLINIC_OR_DEPARTMENT_OTHER): Payer: Medicaid Other | Admitting: Oncology

## 2020-06-07 ENCOUNTER — Encounter: Payer: Self-pay | Admitting: Oncology

## 2020-06-07 ENCOUNTER — Other Ambulatory Visit: Payer: Self-pay

## 2020-06-07 ENCOUNTER — Ambulatory Visit: Admission: RE | Admit: 2020-06-07 | Payer: Medicaid Other | Source: Ambulatory Visit

## 2020-06-07 DIAGNOSIS — Z87891 Personal history of nicotine dependence: Secondary | ICD-10-CM

## 2020-06-07 NOTE — Progress Notes (Signed)
This encounter was created in error - please disregard.

## 2020-06-21 ENCOUNTER — Inpatient Hospital Stay: Payer: Medicaid Other | Attending: Internal Medicine | Admitting: Internal Medicine

## 2020-06-21 ENCOUNTER — Inpatient Hospital Stay: Payer: Medicaid Other

## 2020-06-21 ENCOUNTER — Other Ambulatory Visit: Payer: Self-pay

## 2020-06-21 ENCOUNTER — Encounter: Payer: Self-pay | Admitting: *Deleted

## 2020-06-21 VITALS — BP 134/87 | HR 86 | Temp 96.3°F | Resp 18 | Ht 72.0 in | Wt 193.0 lb

## 2020-06-21 DIAGNOSIS — K859 Acute pancreatitis without necrosis or infection, unspecified: Secondary | ICD-10-CM | POA: Diagnosis not present

## 2020-06-21 DIAGNOSIS — R7989 Other specified abnormal findings of blood chemistry: Secondary | ICD-10-CM

## 2020-06-21 DIAGNOSIS — M2550 Pain in unspecified joint: Secondary | ICD-10-CM | POA: Diagnosis not present

## 2020-06-21 DIAGNOSIS — F1490 Cocaine use, unspecified, uncomplicated: Secondary | ICD-10-CM

## 2020-06-21 DIAGNOSIS — Z79899 Other long term (current) drug therapy: Secondary | ICD-10-CM

## 2020-06-21 DIAGNOSIS — F1290 Cannabis use, unspecified, uncomplicated: Secondary | ICD-10-CM | POA: Diagnosis not present

## 2020-06-21 DIAGNOSIS — F1010 Alcohol abuse, uncomplicated: Secondary | ICD-10-CM

## 2020-06-21 DIAGNOSIS — R5383 Other fatigue: Secondary | ICD-10-CM | POA: Diagnosis not present

## 2020-06-21 DIAGNOSIS — G8929 Other chronic pain: Secondary | ICD-10-CM | POA: Diagnosis not present

## 2020-06-21 DIAGNOSIS — F17210 Nicotine dependence, cigarettes, uncomplicated: Secondary | ICD-10-CM | POA: Diagnosis not present

## 2020-06-21 DIAGNOSIS — E119 Type 2 diabetes mellitus without complications: Secondary | ICD-10-CM

## 2020-06-21 DIAGNOSIS — Z885 Allergy status to narcotic agent status: Secondary | ICD-10-CM

## 2020-06-21 DIAGNOSIS — Z888 Allergy status to other drugs, medicaments and biological substances status: Secondary | ICD-10-CM

## 2020-06-21 DIAGNOSIS — M549 Dorsalgia, unspecified: Secondary | ICD-10-CM

## 2020-06-21 DIAGNOSIS — Z800 Family history of malignant neoplasm of digestive organs: Secondary | ICD-10-CM

## 2020-06-21 LAB — FERRITIN: Ferritin: 408 ng/mL — ABNORMAL HIGH (ref 24–336)

## 2020-06-21 LAB — CBC WITH DIFFERENTIAL/PLATELET
Abs Immature Granulocytes: 0.04 10*3/uL (ref 0.00–0.07)
Basophils Absolute: 0.1 10*3/uL (ref 0.0–0.1)
Basophils Relative: 1 %
Eosinophils Absolute: 0.6 10*3/uL — ABNORMAL HIGH (ref 0.0–0.5)
Eosinophils Relative: 7 %
HCT: 40.8 % (ref 39.0–52.0)
Hemoglobin: 14.1 g/dL (ref 13.0–17.0)
Immature Granulocytes: 1 %
Lymphocytes Relative: 18 %
Lymphs Abs: 1.4 10*3/uL (ref 0.7–4.0)
MCH: 33.6 pg (ref 26.0–34.0)
MCHC: 34.6 g/dL (ref 30.0–36.0)
MCV: 97.1 fL (ref 80.0–100.0)
Monocytes Absolute: 0.8 10*3/uL (ref 0.1–1.0)
Monocytes Relative: 11 %
Neutro Abs: 4.8 10*3/uL (ref 1.7–7.7)
Neutrophils Relative %: 62 %
Platelets: 247 10*3/uL (ref 150–400)
RBC: 4.2 MIL/uL — ABNORMAL LOW (ref 4.22–5.81)
RDW: 12.7 % (ref 11.5–15.5)
WBC: 7.7 10*3/uL (ref 4.0–10.5)
nRBC: 0 % (ref 0.0–0.2)

## 2020-06-21 LAB — COMPREHENSIVE METABOLIC PANEL
ALT: 58 U/L — ABNORMAL HIGH (ref 0–44)
AST: 66 U/L — ABNORMAL HIGH (ref 15–41)
Albumin: 4.2 g/dL (ref 3.5–5.0)
Alkaline Phosphatase: 90 U/L (ref 38–126)
Anion gap: 14 (ref 5–15)
BUN: 15 mg/dL (ref 6–20)
CO2: 21 mmol/L — ABNORMAL LOW (ref 22–32)
Calcium: 9.4 mg/dL (ref 8.9–10.3)
Chloride: 101 mmol/L (ref 98–111)
Creatinine, Ser: 0.91 mg/dL (ref 0.61–1.24)
GFR, Estimated: >60 mL/min (ref >60)
Glucose, Bld: 175 mg/dL — ABNORMAL HIGH (ref 70–99)
Potassium: 4.1 mmol/L (ref 3.5–5.1)
Sodium: 136 mmol/L (ref 135–145)
Total Bilirubin: 0.5 mg/dL (ref 0.3–1.2)
Total Protein: 7.8 g/dL (ref 6.5–8.1)

## 2020-06-21 LAB — IRON AND TIBC
Iron: 104 ug/dL (ref 45–182)
Saturation Ratios: 26 % (ref 17.9–39.5)
TIBC: 395 ug/dL (ref 250–450)
UIBC: 291 ug/dL

## 2020-06-21 LAB — LACTATE DEHYDROGENASE: LDH: 115 U/L (ref 98–192)

## 2020-06-21 LAB — HEPATITIS B SURFACE ANTIGEN: Hepatitis B Surface Ag: NONREACTIVE

## 2020-06-21 LAB — HEPATITIS C ANTIBODY: HCV Ab: NONREACTIVE
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
## 2020-06-21 NOTE — Assessment & Plan Note (Addendum)
#  Elevated ferritin [[id number]]-incidental.  No obvious evidence of any endorgan dysfunction.  #I had a long discussion the patient regarding the patho-biology of hemochromatosis- the fact that elevated iron levels over many years could lead to deposition in the organs leading to-cirrhosis, arthralgias, skin pigmentation, diabetes; central endocrine abnormalities; heart failure etc. However, I'm not overtly concerned about development of clinical hemochromatosis/iron overload in the patient.  #Etiology of elevated ferritin-likely secondary to liver inflammation/hepatitis from alcohol.  Check hemochromatosis genotyping/also check CBC CMP LDH iron studies ferritin.   #Elevated LFTs AST ALT around 100s-likely from alcohol.  Check hepatitis BC.  Counseled to quit drinking alcohol.  # active smoker-Discussed with the patient regarding the ill effects of smoking- including but not limited to cardiac lung and vascular diseases and malignancies.  Counseled at length regarding quitting smoking.  Recommend talking to her PCP regarding smoking cessation.   #Lung cancer screening program:  Discussed regarding lung cancer screening program at length; which includes low-dose CT scan on annual basis for 5 years.  Based upon the findings patient would be recommended surveillance/biopsies/or surgery.  Lung cancer program has shown to save lives by early detection of lung cancer.   # Alcohol abuse-discussed at length regarding development of acute hepatitis/cirrhosis; pancreatitis from heavy alcohol intake.  Recommend moderation/and then eventually quit.  Thank you Ms.Rubio for allowing me to participate in the care of your pleasant patient. Please do not hesitate to contact me with questions or concerns in the interim.   # DISPOSITION:  # labs- today- ordered # follow up in 2 weeks- MD; no labs- Dr.B

## 2020-06-29 LAB — HEMOCHROMATOSIS DNA-PCR(C282Y,H63D)

## 2020-07-05 ENCOUNTER — Inpatient Hospital Stay: Payer: Medicaid Other | Attending: Internal Medicine | Admitting: Internal Medicine

## 2020-07-12 ENCOUNTER — Ambulatory Visit: Admission: RE | Admit: 2020-07-12 | Payer: Medicaid Other | Source: Ambulatory Visit

## 2020-07-12 ENCOUNTER — Inpatient Hospital Stay: Payer: Medicaid Other | Admitting: Hospice and Palliative Medicine

## 2020-07-12 ENCOUNTER — Other Ambulatory Visit: Payer: Self-pay

## 2020-07-25 ENCOUNTER — Telehealth: Payer: Self-pay | Admitting: *Deleted

## 2020-07-25 NOTE — Telephone Encounter (Signed)
Spoke to patient and his girlfriend via telephone and rescheduled his initial lung screening to 07/28/20 @ 1:00pm. Patient was previously a no show x 2. Sent patient's girlfriend a text message with the appointment details to confirm. Current smoker, 0.75 ppd x 40 yrs.

## 2020-07-28 ENCOUNTER — Ambulatory Visit: Admission: RE | Admit: 2020-07-28 | Payer: Medicaid Other | Source: Ambulatory Visit

## 2020-07-28 ENCOUNTER — Other Ambulatory Visit: Payer: Self-pay

## 2020-07-28 ENCOUNTER — Inpatient Hospital Stay: Payer: Medicaid Other | Admitting: Nurse Practitioner

## 2020-08-04 ENCOUNTER — Inpatient Hospital Stay: Payer: Medicaid Other | Attending: Oncology | Admitting: Nurse Practitioner

## 2020-08-04 ENCOUNTER — Other Ambulatory Visit: Payer: Self-pay

## 2020-08-04 ENCOUNTER — Ambulatory Visit
Admission: RE | Admit: 2020-08-04 | Discharge: 2020-08-04 | Disposition: A | Discharge status: Home / Self Care | Payer: Medicaid Other | Source: Ambulatory Visit | Attending: Oncology | Admitting: Oncology

## 2020-08-04 DIAGNOSIS — F17200 Nicotine dependence, unspecified, uncomplicated: Secondary | ICD-10-CM | POA: Diagnosis present

## 2020-08-04 DIAGNOSIS — Z122 Encounter for screening for malignant neoplasm of respiratory organs: Secondary | ICD-10-CM | POA: Diagnosis present

## 2020-08-04 DIAGNOSIS — Z87891 Personal history of nicotine dependence: Secondary | ICD-10-CM | POA: Diagnosis not present

## 2020-08-04 DIAGNOSIS — F17210 Nicotine dependence, cigarettes, uncomplicated: Secondary | ICD-10-CM | POA: Diagnosis not present

## 2020-08-04 NOTE — Progress Notes (Signed)
Virtual Visit via Video Enabled Telemedicine Note   I connected with Austin Berry on 08/04/20 at 1:30 PM EST by video enabled telemedicine visit and verified that I am speaking with the correct person using two identifiers.   I discussed the limitations, risks, security and privacy concerns of performing an evaluation and management service by telemedicine and the availability of in-person appointments. I also discussed with the patient that there may be a patient responsible charge related to this service. The patient expressed understanding and agreed to proceed.   Other persons participating in the visit and their role in the encounter: Burgess Estelle, RN- checking in patient & navigation  Patient's location: [place]  Provider's location: Clinic  Chief Complaint: Low Dose CT Screening  Patient agreed to evaluation by telemedicine to discuss shared decision making for consideration of low dose CT lung cancer screening.    In accordance with CMS guidelines, patient has met eligibility criteria including age, absence of signs or symptoms of lung cancer.  Social History   Tobacco Use  . Smoking status: Current Every Day Smoker    Packs/day: 0.75    Years: 40.00    Pack years: 30.00    Types: Cigarettes  . Smokeless tobacco: Never Used  Substance Use Topics  . Alcohol use: Yes    Alcohol/week: 14.0 - 24.0 standard drinks    Types: 14 - 24 Standard drinks or equivalent per week     A shared decision-making session was conducted prior to the performance of CT scan. This includes one or more decision aids, includes benefits and harms of screening, follow-up diagnostic testing, over-diagnosis, false positive rate, and total radiation exposure.   Counseling on the importance of adherence to annual lung cancer LDCT screening, impact of co-morbidities, and ability or willingness to undergo diagnosis and treatment is imperative for compliance of the program.   Counseling on the  importance of continued smoking cessation for former smokers; the importance of smoking cessation for current smokers, and information about tobacco cessation interventions have been given to patient including [place] and [employer] [place] programs.   Written order for lung cancer screening with LDCT has been given to the patient and any and all questions have been answered to the best of my abilities.    Yearly follow up will be coordinated by Burgess Estelle, Thoracic Navigator.  I discussed the assessment and treatment plan with the patient. The patient was provided an opportunity to ask questions and all were answered. The patient agreed with the plan and demonstrated an understanding of the instructions.   The patient was advised to call back or seek an in-person evaluation if the symptoms worsen or if the condition fails to improve as anticipated.   I provided 15 minutes of face-to-face video visit time dedicated to the care of this patient on the date of this encounter to include pre-visit review of smoking history, face-to-face time with the patient, and post visit ordering of testing/documentation.   [name], DNP, AGNP-C [facility] [facility] [phone] (clinic)

## 2020-08-08 ENCOUNTER — Encounter: Payer: Self-pay | Admitting: *Deleted

## 2020-08-25 ENCOUNTER — Ambulatory Visit (INDEPENDENT_AMBULATORY_CARE_PROVIDER_SITE_OTHER): Payer: Medicaid Other | Admitting: Podiatry

## 2020-08-25 ENCOUNTER — Other Ambulatory Visit: Payer: Self-pay

## 2020-08-25 ENCOUNTER — Ambulatory Visit: Payer: Medicaid Other

## 2020-08-25 DIAGNOSIS — M7742 Metatarsalgia, left foot: Secondary | ICD-10-CM

## 2020-08-25 DIAGNOSIS — M7741 Metatarsalgia, right foot: Secondary | ICD-10-CM

## 2020-08-25 DIAGNOSIS — G629 Polyneuropathy, unspecified: Secondary | ICD-10-CM

## 2020-08-25 DIAGNOSIS — M79676 Pain in unspecified toe(s): Secondary | ICD-10-CM | POA: Diagnosis not present

## 2020-08-25 DIAGNOSIS — L600 Ingrowing nail: Secondary | ICD-10-CM | POA: Diagnosis not present

## 2020-08-25 NOTE — Progress Notes (Signed)
  Subjective:  Patient ID: Austin Berry, male    DOB: [birth year],  MRN: [id number]  Chief Complaint  Patient presents with  . Peripheral Neuropathy    Burning/tingling/numbness in btwn toes and in balls of feet going on for a few months now.   . Diabetes    Last A1C- Around 7.0     55 y.o. male presents with the above complaint. History confirmed with patient. Thinks it may be more caused by his nails rubbing against his toes.  Objective:  Physical Exam: warm, good capillary refill, no trophic changes or ulcerative lesions, normal DP and PT pulses and normal sensory exam. No pain to palpation about the metatarsal heads plantarly. Mild intermetatarsal POP 2nd interspace No images are attached to the encounter. Assessment:   1. Neuropathy   2. Ingrown nail   3. Pain around toenail   4. Metatarsalgia of both feet    Plan:  Patient was evaluated and treated and all questions answered.  Metatarsalgia -Educated on etiology -Discussed padding and proper shoegear -Offloading pad applied   Ingrown Nails -Debrided in slant back fashion to patient relief  Return if symptoms worsen or fail to improve.

## 2020-08-25 NOTE — Addendum Note (Signed)
Addended by: Jodelle Red D on: 08/25/2020 02:26 PM   Modules accepted: Orders

## 2021-04-15 IMAGING — US US ABDOMEN COMPLETE
1 series · 15 of 25 positions shown · non-contrast
Comparison: Report of CT Abdomen and Pelvis 01/27/2003 (no images
available).

CLINICAL DATA: 53-year-old male with elevated transaminase.

EXAM:
ABDOMEN ULTRASOUND COMPLETE

[Series 1: us abdomen complete · 15 of 81 slices shown]
[im 1/81]
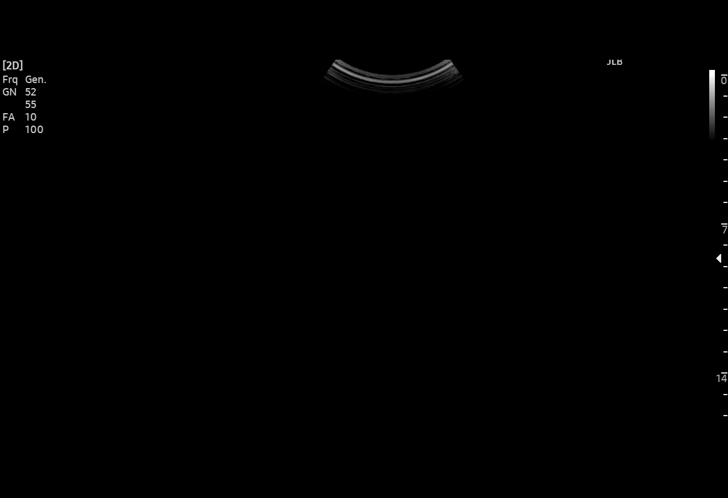
[im 7/81]
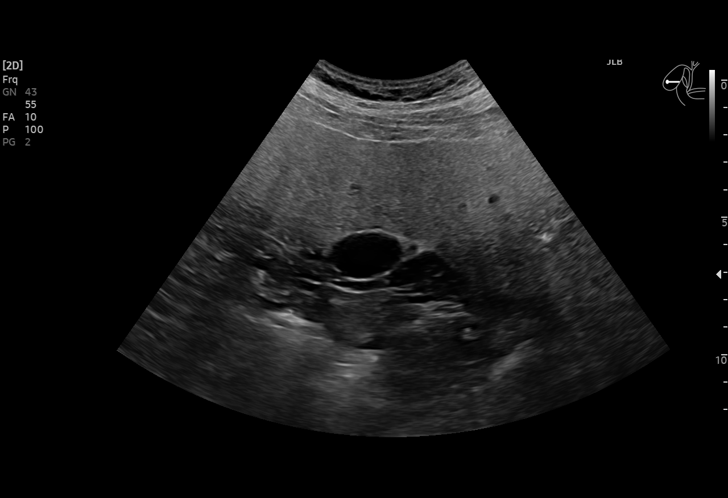
[im 14/81]
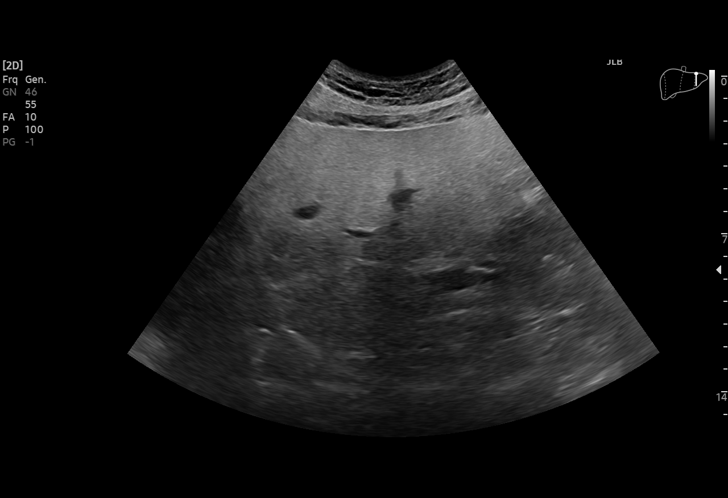
[im 17/81]
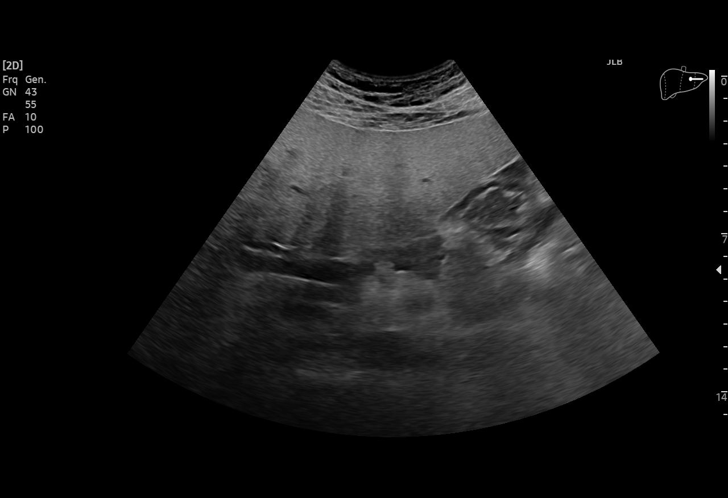
[im 24/81]
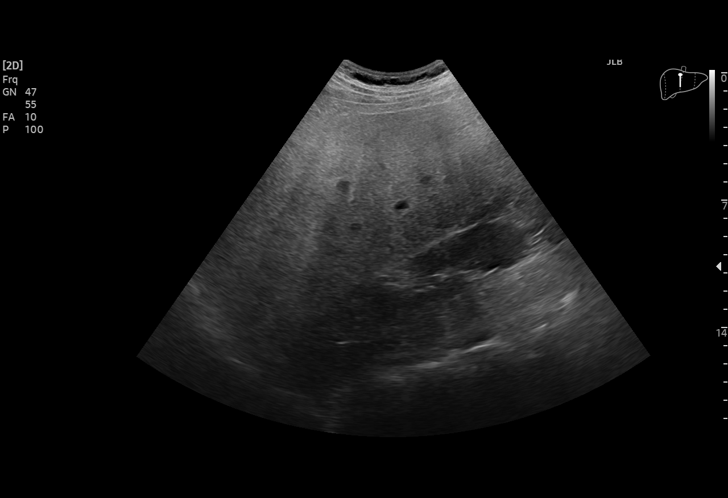
[im 31/81]
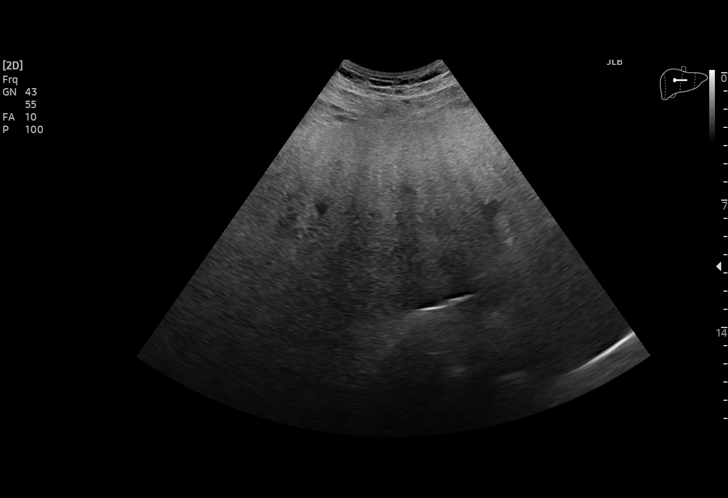
[im 34/81]
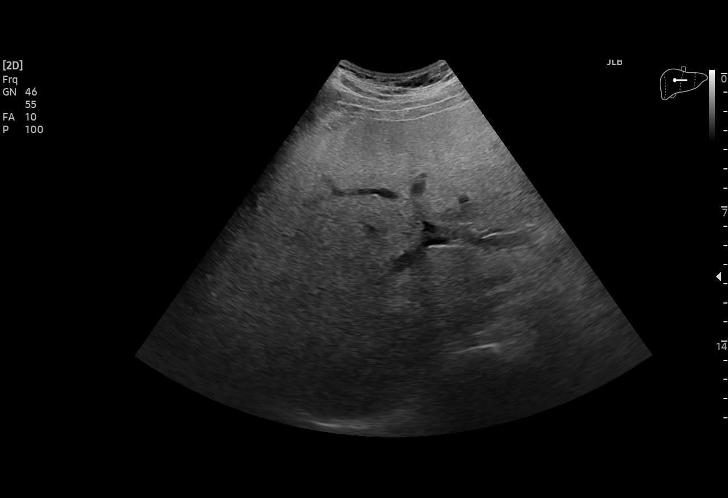
[im 41/81]
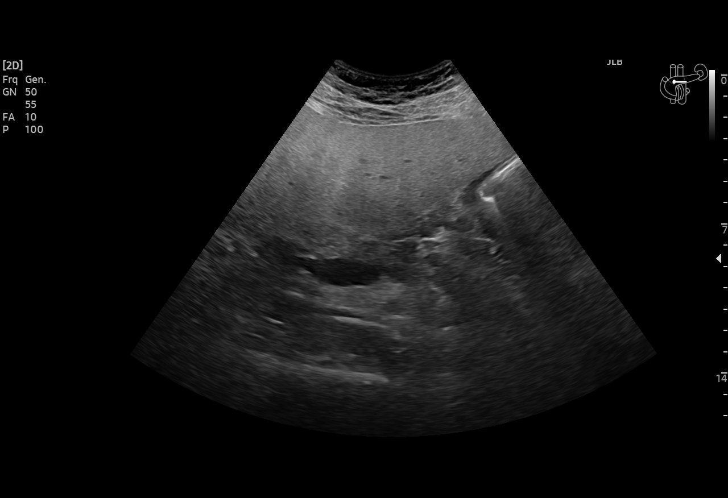
[im 47/81]
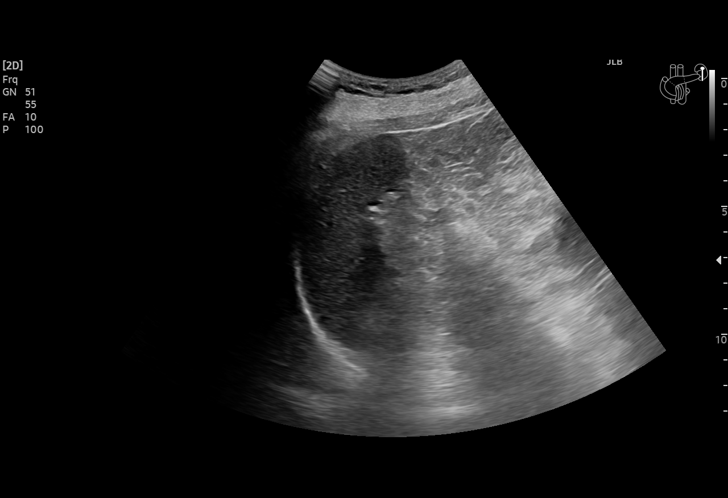
[im 51/81]
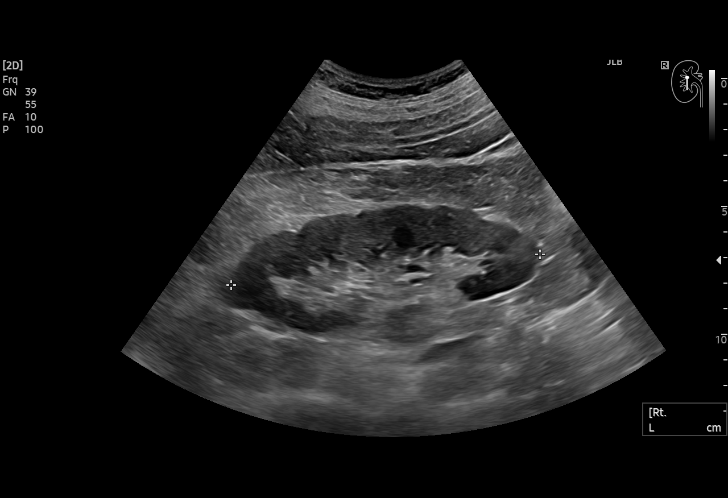
[im 57/81]
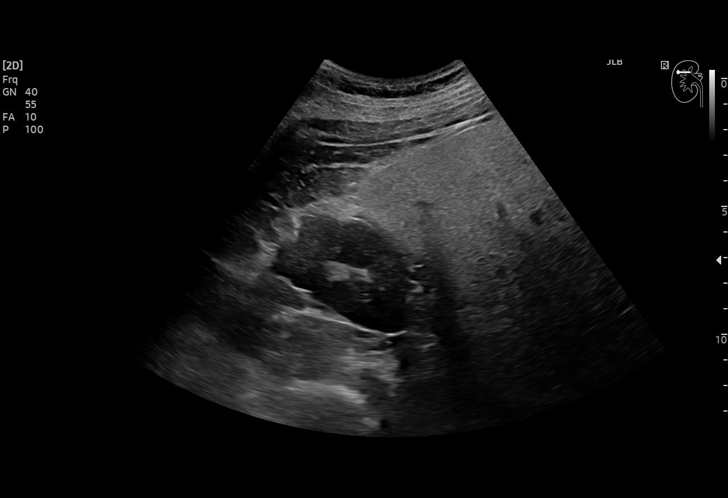
[im 64/81]
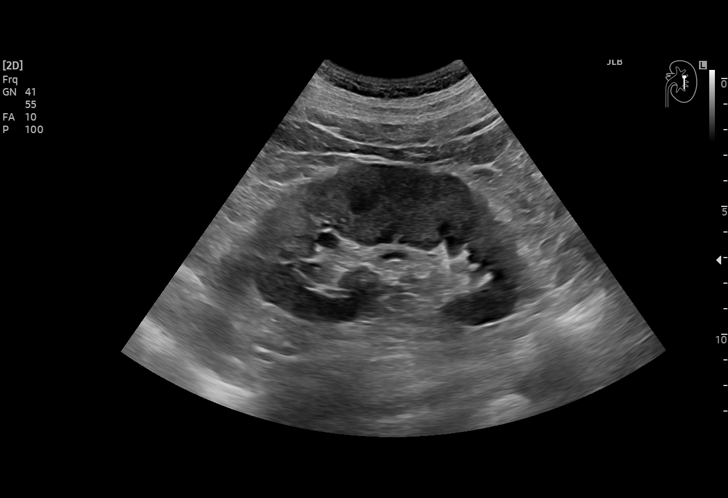
[im 67/81]
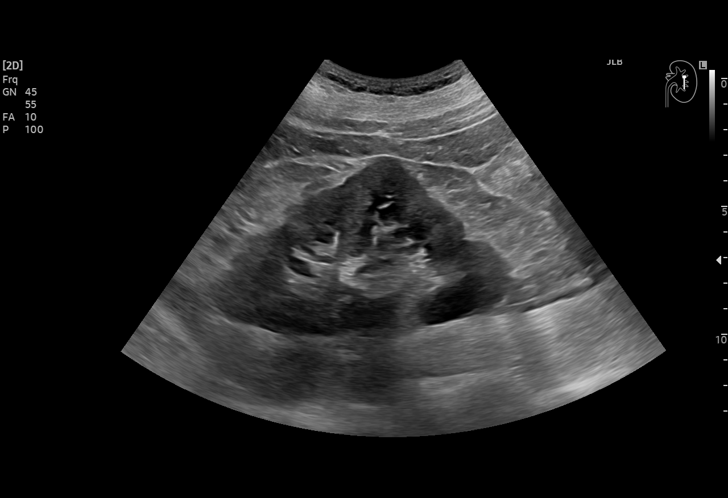
[im 74/81]
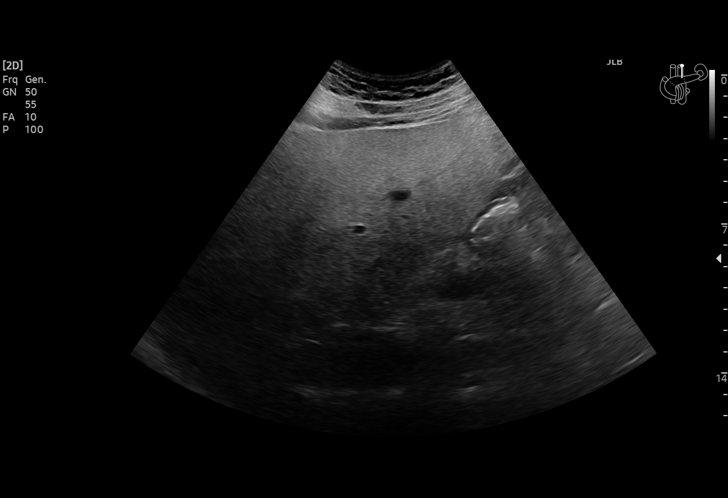
[im 81/81]
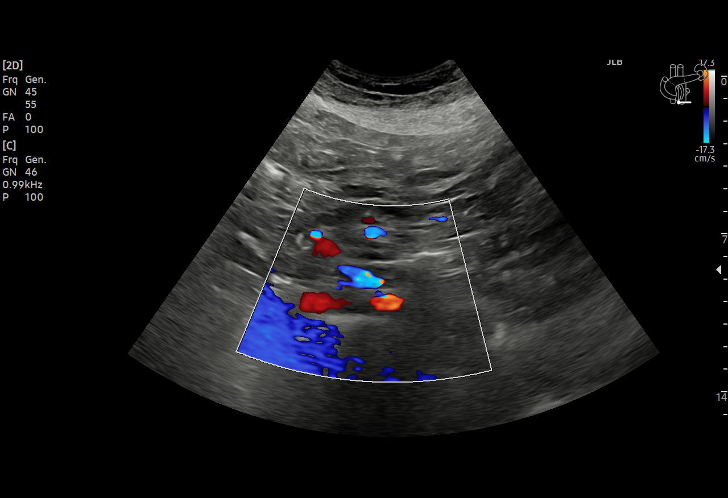

[15 of 25 positions shown; findings below may reference images not displayed]

FINDINGS: Gallbladder: No gallstones or wall thickening visualized. No
sonographic Murphy sign noted by sonographer.

Common bile duct: Diameter: 4 mm, normal.

Liver: Diffusely echogenic liver (image 28). Dense liver parenchyma,
difficult to penetrate (image 21). No discrete liver lesion. No
intrahepatic biliary ductal dilatation. Hepatomegaly suspected
(liver length estimated at 20 cm on image 27). Portal vein is patent
on color Doppler imaging with normal direction of blood flow towards
the liver.

IVC: No abnormality visualized.

Pancreas: Visualized portion unremarkable.

Spleen: Size and appearance within normal limits. Splenic length is
5.5 cm.

Right Kidney: Length: 12.1 cm. Echogenicity within normal limits. No
mass or hydronephrosis visualized.

Left Kidney: Length: 10.9 cm. Echogenicity within normal limits. No
mass or hydronephrosis visualized.

Abdominal aorta: No aneurysm visualized. Luminal irregularity in
keeping with aortic atherosclerosis (image 80).

Other findings: No free fluid.
IMPRESSION: 1. Evidence of hepatomegaly and hepatic steatosis.
2. No discrete liver lesion by ultrasound. No stigmata of portal
venous hypertension identified.
3. Evidence of aortic atherosclerosis.

## 2021-05-23 ENCOUNTER — Other Ambulatory Visit: Payer: Self-pay

## 2021-05-24 ENCOUNTER — Telehealth: Payer: Self-pay

## 2021-05-24 ENCOUNTER — Other Ambulatory Visit: Payer: Self-pay

## 2021-06-07 ENCOUNTER — Other Ambulatory Visit: Payer: Self-pay

## 2021-06-07 NOTE — Progress Notes (Signed)
Was going to schedule for colonoscopy per refferal but patient is having reflux and a lot of nausea so made a in person appointment to be seen 09/05/2021 ?

## 2021-06-14 ENCOUNTER — Telehealth: Payer: Self-pay | Admitting: Orthopedic Surgery

## 2021-06-14 NOTE — Telephone Encounter (Signed)
Patient called and is requesting a consult with Dr. Romeo Apple.  He has been seeing his PCP Dr. Felecia Shelling for his Right Shoulder issues for about 20 years.  Advised patient we will need a referral with office notes to schedule an appt for him.  He will call Dr. Letitia Neri office tomorrow for them to send [place] the referral.  ?

## 2021-08-13 ENCOUNTER — Ambulatory Visit: Payer: Self-pay | Admitting: Gastroenterology

## 2021-08-13 ENCOUNTER — Other Ambulatory Visit: Payer: Self-pay

## 2021-08-13 ENCOUNTER — Encounter: Payer: Self-pay | Admitting: Gastroenterology

## 2021-08-13 ENCOUNTER — Ambulatory Visit (INDEPENDENT_AMBULATORY_CARE_PROVIDER_SITE_OTHER): Payer: Medicaid Other | Admitting: Gastroenterology

## 2021-08-13 VITALS — BP 136/79 | HR 76 | Temp 99.0°F | Ht 72.0 in | Wt 194.2 lb

## 2021-08-13 DIAGNOSIS — R7989 Other specified abnormal findings of blood chemistry: Secondary | ICD-10-CM

## 2021-08-13 DIAGNOSIS — K219 Gastro-esophageal reflux disease without esophagitis: Secondary | ICD-10-CM

## 2021-08-13 DIAGNOSIS — R112 Nausea with vomiting, unspecified: Secondary | ICD-10-CM | POA: Diagnosis not present

## 2021-08-13 DIAGNOSIS — Z1211 Encounter for screening for malignant neoplasm of colon: Secondary | ICD-10-CM

## 2021-08-13 MED ORDER — OMEPRAZOLE 40 MG PO CPDR: 40.0000 mg | DELAYED_RELEASE_CAPSULE | Freq: Every day | ORAL | 3 refills | Status: DC

## 2021-08-13 MED ORDER — NA SULFATE-K SULFATE-MG SULF 17.5-3.13-1.6 GM/177ML PO SOLN: 708.0000 mL | Freq: Once | ORAL | 0 refills | Status: AC

## 2021-08-13 NOTE — Patient Instructions (Signed)
? ?This is the pillow that he should get. ? ?Food Choices for Gastroesophageal Reflux Disease, Adult ?When you have gastroesophageal reflux disease (GERD), the foods you eat and your eating habits are very important. Choosing the right foods can help ease your discomfort. Think about working with a food expert (dietitian) to help you make good choices. ?What are tips for following this plan? ?Reading food labels ?Look for foods that are low in saturated fat. Foods that may help with your symptoms include: ?Foods that have less than 5% of daily value (DV) of fat. ?Foods that have 0 grams of trans fat. ?Cooking ?Do not fry your food. ?Cook your food by baking, steaming, grilling, or broiling. These are all methods that do not need a lot of fat for cooking. ?To add flavor, try to use herbs that are low in spice and acidity. ?Meal planning ? ?Choose healthy foods that are low in fat, such as: ?Fruits and vegetables. ?Whole grains. ?Low-fat dairy products. ?Lean meats, fish, and poultry. ?Eat small meals often instead of eating 3 large meals each day. Eat your meals slowly in a place where you are relaxed. Avoid bending over or lying down until 2-3 hours after eating. ?Limit high-fat foods such as fatty meats or fried foods. ?Limit your intake of fatty foods, such as oils, butter, and shortening. ?Avoid the following as told by your doctor: ?Foods that cause symptoms. These may be different for different people. Keep a food diary to keep track of foods that cause symptoms. ?Alcohol. ?Drinking a lot of liquid with meals. ?Eating meals during the 2-3 hours before bed. ?Lifestyle ?Stay at a healthy weight. Ask your doctor what weight is healthy for you. If you need to lose weight, work with your doctor to do so safely. ?Exercise for at least 30 minutes on 5 or more days each week, or as told by your doctor. ?Wear loose-fitting clothes. ?Do not smoke or use any products that contain nicotine or tobacco. If you need help  quitting, ask your doctor. ?Sleep with the head of your bed higher than your feet. Use a wedge under the mattress or blocks under the bed frame to raise the head of the bed. ?Chew sugar-free gum after meals. ?What foods should eat? ? ?Eat a healthy, well-balanced diet of fruits, vegetables, whole grains, low-fat dairy products, lean meats, fish, and poultry. Each person is different. ?Foods that may cause symptoms in one person may not cause any symptoms in another person. Work with your doctor to find foods that are safe for you. ?The items listed above may not be a complete list of what you can eat and drink. Contact a food expert for more options. ?What foods should I avoid? ?Limiting some of these foods may help in managing the symptoms of GERD. Everyone is different. Talk with a food expert or your doctor to help you find the exact foods to avoid, if any. ?Fruits ?Any fruits prepared with added fat. Any fruits that cause symptoms. For some people, this may include citrus fruits, such as oranges, grapefruit, pineapple, and lemons. ?Vegetables ?Deep-fried vegetables. [place] fries. Any vegetables prepared with added fat. Any vegetables that cause symptoms. For some people, this may include tomatoes and tomato products, chili peppers, onions and garlic, and horseradish. ?Grains ?Pastries or quick breads with added fat. ?Meats and other proteins ?High-fat meats, such as fatty beef or pork, hot dogs, ribs, ham, sausage, salami, and bacon. Fried meat or protein, including fried fish and fried  chicken. Nuts and nut butters, in large amounts. ?Dairy ?Whole milk and chocolate milk. Sour cream. Cream. Ice cream. Cream cheese. Milkshakes. ?Fats and oils ?Butter. Margarine. Shortening. Ghee. ?Beverages ?Coffee and tea, with or without caffeine. Carbonated beverages. Sodas. Energy drinks. Fruit juice made with acidic fruits, such as orange or grapefruit. Tomato juice. Alcoholic drinks. ?Sweets and desserts ?Chocolate and  cocoa. Donuts. ?Seasonings and condiments ?Pepper. Peppermint and spearmint. Added salt. Any condiments, herbs, or seasonings that cause symptoms. For some people, this may include curry, hot sauce, or vinegar-based salad dressings. ?The items listed above may not be a complete list of what you should not eat and drink. Contact a food expert for more options. ?Questions to ask your doctor ?Diet and lifestyle changes are often the first steps that are taken to manage symptoms of GERD. If diet and lifestyle changes do not help, talk with your doctor about taking medicines. ?Where to find more information ?International Foundation for Gastrointestinal Disorders: aboutgerd.org ?Summary ?When you have GERD, food and lifestyle choices are very important in easing your symptoms. ?Eat small meals often instead of 3 large meals a day. Eat your meals slowly and in a place where you are relaxed. ?Avoid bending over or lying down until 2-3 hours after eating. ?Limit high-fat foods such as fatty meats or fried foods. ?This information is not intended to replace advice given to you by your health care provider. Make sure you discuss any questions you have with your health care provider. ?Document Revised: 09/27/2019 Document Reviewed: 09/27/2019 ?Elsevier Patient Education ? 2023 [employer] ? ?

## 2021-08-13 NOTE — Progress Notes (Signed)
?  ?Austin Bellows MD, MRCP(U.K) ?[address]  ?[address]  ?[place], [place] [address]  ?Main: [phone]  ?Fax: [phone] ? ? ?Primary Care Physician: Freddy Finner, NP ? ?Primary Gastroenterologist:  Dr. Jonathon Berry  ? ?Chief Complaint  ?Patient presents with  ? Elevated LFTs  ? ? ?HPI: Austin Berry is a 56 y.o. male ? ? ?Summary of history : ? ?He is a patient previously seen by Dr. Bonna Gains in November 2020.  History of reflux.  H. pylori was positive in August 2020 and underwent therapy with triple therapy.  History of constipation treated with high-fiber diet.  Colonoscopy in 2020 showed nonbleeding internal hemorrhoids poor prep was recommended repeat colonoscopy in 1 year.  Did not follow-up afterwards.  He tested subsequently negative H. pylori. ? ?Interval history ? ?Afteralmost 2-1/2 years he has been referred for a screening colonoscopy in addition to which having reflux and nausea. ?He states that he has regurgitation every day at night after his dinner when he lays flat.  Usually does not go to bed for at least an hour after dinner.  He has been on Pepcid once a day it has not controlled his symptoms.  Not tried PPI.  He states his LFTs were mildly elevated in March 2023 and reviewing of his labs show that his AST and ALT were elevated in the 60s.  Previously normal.  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  He has been incarcerated many years back.  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 ?  ? ?Current Outpatient Medications  ?Medication Sig Dispense Refill  ? acetaminophen (TYLENOL) 325 MG tablet Take 650 mg by mouth every 6 (six) hours as needed for mild pain or headache.    ? ALPHAGAN P 0.1 % SOLN Apply 1 drop to eye 2 (two) times daily.    ? amoxicillin-clavulanate (AUGMENTIN XR) 1000-62.5 MG 12 hr tablet TAKE 1 TABLET BY MOUTH TWICE A DAY FOR 10 DAYS FOR INFECTION    ? atorvastatin (LIPITOR) 40 MG tablet Take 40 mg by mouth daily.    ? Blood  Pressure Monitoring (BLOOD PRESSURE KIT) KIT Use 1 kit as directed once a day  to monitor BP. Needs to be a talking BP machine due to patient blindness    ? brimonidine (ALPHAGAN) 0.2 % ophthalmic solution 3 (three) times daily.    ? cetirizine (ZYRTEC) 10 MG tablet TAKE 1 TABLET EVERY DAY FOR ALLERGY.    ? chlordiazePOXIDE (LIBRIUM) 10 MG capsule TAKE ONE CAPSULE BY MOUTH UP TO 3 TIMES PER DAY FOR ALCOHOL CESSATION    ? Disulfiram 500 MG TABS TAKE ONE TABLET BY MOUTH EACH MORNING, START 05/11/20    ? dorzolamide (TRUSOPT) 2 % ophthalmic solution Place 1 drop into both eyes daily.    ? dorzolamide-timolol (COSOPT) 22.3-6.8 MG/ML ophthalmic solution INSTILL 1 DROP IN RIGHT EYE 2 TIMES DAILY    ? famotidine (PEPCID) 20 MG tablet TAKE 1 TABLET BY MOUTH EVERY DAY 30 tablet 0  ? fluticasone (FLONASE) 50 MCG/ACT nasal spray 2 SPRAYS TO EACH NOSTRIL DAILY FOR ALLERGIES    ? gabapentin (NEURONTIN) 300 MG capsule Take 300 mg by mouth at bedtime.    ? hydrochlorothiazide (HYDRODIURIL) 25 MG tablet Take 25 mg by mouth every morning.    ? latanoprost (XALATAN) 0.005 % ophthalmic solution Apply to eye.    ? LISINOPRIL PO Take 1 tablet by mouth daily.     ?  metFORMIN (GLUCOPHAGE) 500 MG tablet Take 1 tablet (500 mg total) by mouth 2 (two) times daily with a meal. 60 tablet 0  ? naproxen (NAPROSYN) 500 MG tablet TAKE 1 TAB BY MOUTH TWICE DAILY AS NEEDED FOR PAIN X 2 4 WEEKS    ? PROAIR HFA 108 (90 Base) MCG/ACT inhaler Inhale 2 puffs into the lungs every 4 (four) hours as needed.    ? psyllium (METAMUCIL) 58.6 % packet Take 1 packet by mouth daily.    ? ROCKLATAN 0.02-0.005 % SOLN Place 1 drop into the right eye daily.    ? sertraline (ZOLOFT) 100 MG tablet Take 1 tablet by mouth daily.    ? sertraline (ZOLOFT) 50 MG tablet Take 50 mg by mouth daily.    ? sulfamethoxazole-trimethoprim (BACTRIM DS) 800-160 MG tablet TAKE 1 TABLET BY MOUTH TWICE A DAY FOR 3 DAYS FOR UTI    ? traZODone (DESYREL) 100 MG tablet TAKE 1 TABLET BY MOUTH  AT BEDTIME MAY TAKE 1/2 TO 1 WHOLE TABLET AS NEEDED FOR SLEEP    ? zolpidem (AMBIEN) 5 MG tablet Take 5 mg by mouth at bedtime.    ? ?No current facility-administered medications for this visit.  ? ? ?Allergies as of 08/13/2021 - Review Complete 08/13/2021  ?Allergen Reaction Noted  ? Tramadol Itching 04/17/2015  ? ? ?ROS: ? ?General: Negative for anorexia, weight loss, fever, chills, fatigue, weakness. ?ENT: Negative for hoarseness, difficulty swallowing , nasal congestion. ?CV: Negative for chest pain, angina, palpitations, dyspnea on exertion, peripheral edema.  ?Respiratory: Negative for dyspnea at rest, dyspnea on exertion, cough, sputum, wheezing.  ?GI: See history of present illness. ?GU:  Negative for dysuria, hematuria, urinary incontinence, urinary frequency, nocturnal urination.  ?Endo: Negative for unusual weight change.  ?  ?Physical Examination: ? ? BP 136/79   Pulse 76   Temp 99 ?F (37.2 ?C) (Oral)   Ht 6' (1.829 m)   Wt 194 lb 3.2 oz (88.1 kg)   BMI 26.34 kg/m?  ? ?General: Well-nourished, well-developed in no acute distress.  ?Eyes: Unable to see ?Mouth: Oropharyngeal mucosa moist and pink , no lesions erythema or exudate. ?Neuro: Alert and oriented x 3.  Grossly intact. ?Skin: Warm and dry, no jaundice.   ?Psych: Alert and cooperative, normal mood and affect. ? ? ?Imaging Studies: ?No results found. ? ?Assessment and Plan:  ? ?Austin Berry is a 56 y.o. y/o male with a history of GERD, referred for colon cancer screening reflux and nausea.  Last colonoscopy was 2 and half years back poor prep.  Repeat was recommended in 1 year but he did not schedule the same subsequently.  Also concern for mild elevation of transaminases history of chronic alcohol use most likely the underlying cause.  History of high risk behavior ? ? ?Plan ?1.  EGD and colonoscopy to evaluate for worsening of reflux, nausea and colon cancer screening.  Previously had poor prep.  Recommend 2-day prep ?2.  GERD patient  information provided counseled about lifestyle changes keeping head of the bed elevated using a wedge pillow.  Commenced on Prilosec 40 mg once a day previously on Pepcid which has not worked. ?3.  Elevated liver enzymes in March 2023, history of chronic alcohol consumption on a daily basis for over 15 years.  Advised to stop drinking alcohol we will recheck CMP due to high risk behavior if LFTs are still elevated we will proceed with full autoimmune and viral hepatitis work-up ? ? ?I have discussed  alternative options, risks & benefits,  which include, but are not limited to, bleeding, infection, perforation,respiratory complication & drug reaction.  The patient agrees with this plan & written consent will be obtained.   ? ? ? ? ?Dr Austin Bellows  MD,MRCP [facility]) ?Follow up in 4 months  ?

## 2021-08-15 ENCOUNTER — Telehealth: Payer: Self-pay

## 2021-08-15 LAB — COMPREHENSIVE METABOLIC PANEL
ALT: 60 IU/L — ABNORMAL HIGH (ref 0–44)
AST: 66 IU/L — ABNORMAL HIGH (ref 0–40)
Albumin/Globulin Ratio: 1.7 (ref 1.2–2.2)
Albumin: 4.8 g/dL (ref 3.8–4.9)
Alkaline Phosphatase: 112 IU/L (ref 44–121)
BUN/Creatinine Ratio: 14 (ref 9–20)
BUN: 30 mg/dL — ABNORMAL HIGH (ref 6–24)
Bilirubin Total: 0.4 mg/dL (ref 0.0–1.2)
CO2: 13 mmol/L — ABNORMAL LOW (ref 20–29)
Calcium: 9.8 mg/dL (ref 8.7–10.2)
Chloride: 104 mmol/L (ref 96–106)
Creatinine, Ser: 2.08 mg/dL — ABNORMAL HIGH (ref 0.76–1.27)
Globulin, Total: 2.8 g/dL (ref 1.5–4.5)
Glucose: 161 mg/dL — ABNORMAL HIGH (ref 70–99)
Potassium: 5.3 mmol/L — ABNORMAL HIGH (ref 3.5–5.2)
Sodium: 139 mmol/L (ref 134–144)
Total Protein: 7.6 g/dL (ref 6.0–8.5)
eGFR: 36 mL/min/{1.73_m2} — ABNORMAL LOW (ref >59)

## 2021-08-15 NOTE — Telephone Encounter (Signed)
Dr. Charlotte Crumb me letting me know that this patient has acute renal failure on his labs - needs to go to ER for iv fluids. Marcelino Duster, CMA tried to call his cell phone, home phone and friend's phone and no luck. I then called his PCP's (Mrs. Sandrea Hughs, NP) office-[facility] and I was able to leave them a message with her nurse to let Mrs. Seward Carol know that the patient's labs showed acute renal failure. She stated that they would get in contact with the patient to let him know that he needed to go to the ER. Dr. Tobi Bastos was informed. ?

## 2021-08-15 NOTE — Progress Notes (Signed)
Needs to go to Er for IV fluids , has acute renal failure

## 2021-08-17 ENCOUNTER — Other Ambulatory Visit: Payer: Self-pay

## 2021-08-17 ENCOUNTER — Observation Stay (HOSPITAL_COMMUNITY)
Admission: EM | Admit: 2021-08-17 | Discharge: 2021-08-18 | Disposition: A | Discharge status: Home / Self Care | Payer: Medicaid Other | Attending: Internal Medicine | Admitting: Internal Medicine

## 2021-08-17 ENCOUNTER — Encounter (HOSPITAL_COMMUNITY): Payer: Self-pay | Admitting: *Deleted

## 2021-08-17 DIAGNOSIS — Z7984 Long term (current) use of oral hypoglycemic drugs: Secondary | ICD-10-CM | POA: Diagnosis not present

## 2021-08-17 DIAGNOSIS — I10 Essential (primary) hypertension: Secondary | ICD-10-CM | POA: Diagnosis not present

## 2021-08-17 DIAGNOSIS — E119 Type 2 diabetes mellitus without complications: Secondary | ICD-10-CM | POA: Diagnosis not present

## 2021-08-17 DIAGNOSIS — H547 Unspecified visual loss: Secondary | ICD-10-CM

## 2021-08-17 DIAGNOSIS — F17210 Nicotine dependence, cigarettes, uncomplicated: Secondary | ICD-10-CM | POA: Insufficient documentation

## 2021-08-17 DIAGNOSIS — H44512 Absolute glaucoma, left eye: Secondary | ICD-10-CM | POA: Diagnosis not present

## 2021-08-17 DIAGNOSIS — H543 Unqualified visual loss, both eyes: Secondary | ICD-10-CM

## 2021-08-17 DIAGNOSIS — E875 Hyperkalemia: Secondary | ICD-10-CM | POA: Diagnosis not present

## 2021-08-17 DIAGNOSIS — R7989 Other specified abnormal findings of blood chemistry: Secondary | ICD-10-CM

## 2021-08-17 DIAGNOSIS — R799 Abnormal finding of blood chemistry, unspecified: Secondary | ICD-10-CM | POA: Diagnosis present

## 2021-08-17 DIAGNOSIS — N179 Acute kidney failure, unspecified: Secondary | ICD-10-CM | POA: Insufficient documentation

## 2021-08-17 DIAGNOSIS — Z79899 Other long term (current) drug therapy: Secondary | ICD-10-CM | POA: Insufficient documentation

## 2021-08-17 LAB — COMPREHENSIVE METABOLIC PANEL
ALT: 72 U/L — ABNORMAL HIGH (ref 0–44)
AST: 78 U/L — ABNORMAL HIGH (ref 15–41)
Albumin: 4.4 g/dL (ref 3.5–5.0)
Alkaline Phosphatase: 103 U/L (ref 38–126)
Anion gap: 8 (ref 5–15)
BUN: 31 mg/dL — ABNORMAL HIGH (ref 6–20)
CO2: 23 mmol/L (ref 22–32)
Calcium: 9.9 mg/dL (ref 8.9–10.3)
Chloride: 107 mmol/L (ref 98–111)
Creatinine, Ser: 2.05 mg/dL — ABNORMAL HIGH (ref 0.61–1.24)
GFR, Estimated: 37 mL/min — ABNORMAL LOW (ref >60)
Glucose, Bld: 137 mg/dL — ABNORMAL HIGH (ref 70–99)
Potassium: 6.4 mmol/L (ref 3.5–5.1)
Sodium: 138 mmol/L (ref 135–145)
Total Bilirubin: 0.9 mg/dL (ref 0.3–1.2)
Total Protein: 8 g/dL (ref 6.5–8.1)

## 2021-08-17 LAB — URINALYSIS, ROUTINE W REFLEX MICROSCOPIC
Bacteria, UA: NONE SEEN
Bilirubin Urine: NEGATIVE
Glucose, UA: 50 mg/dL — AB
Hgb urine dipstick: NEGATIVE
Ketones, ur: NEGATIVE mg/dL
Nitrite: NEGATIVE
Protein, ur: NEGATIVE mg/dL
Specific Gravity, Urine: 1.008 (ref 1.005–1.030)
pH: 5 (ref 5.0–8.0)

## 2021-08-17 LAB — POTASSIUM: Potassium: 4.6 mmol/L (ref 3.5–5.1)

## 2021-08-17 LAB — CBC WITH DIFFERENTIAL/PLATELET
Abs Immature Granulocytes: 0.09 10*3/uL — ABNORMAL HIGH (ref 0.00–0.07)
Basophils Absolute: 0.1 10*3/uL (ref 0.0–0.1)
Basophils Relative: 1 %
Eosinophils Absolute: 0.3 10*3/uL (ref 0.0–0.5)
Eosinophils Relative: 5 %
HCT: 37.9 % — ABNORMAL LOW (ref 39.0–52.0)
Hemoglobin: 12.8 g/dL — ABNORMAL LOW (ref 13.0–17.0)
Immature Granulocytes: 1 %
Lymphocytes Relative: 14 %
Lymphs Abs: 1 10*3/uL (ref 0.7–4.0)
MCH: 35 pg — ABNORMAL HIGH (ref 26.0–34.0)
MCHC: 33.8 g/dL (ref 30.0–36.0)
MCV: 103.6 fL — ABNORMAL HIGH (ref 80.0–100.0)
Monocytes Absolute: 1.1 10*3/uL — ABNORMAL HIGH (ref 0.1–1.0)
Monocytes Relative: 15 %
Neutro Abs: 4.5 10*3/uL (ref 1.7–7.7)
Neutrophils Relative %: 64 %
Platelets: 200 10*3/uL (ref 150–400)
RBC: 3.66 MIL/uL — ABNORMAL LOW (ref 4.22–5.81)
RDW: 13.6 % (ref 11.5–15.5)
WBC: 7.1 10*3/uL (ref 4.0–10.5)
nRBC: 0 % (ref 0.0–0.2)

## 2021-08-17 LAB — HEMOGLOBIN A1C
Hgb A1c MFr Bld: 6.3 % — ABNORMAL HIGH (ref 4.8–5.6)
Mean Plasma Glucose: 134.11 mg/dL

## 2021-08-17 LAB — CBG MONITORING, ED
Glucose-Capillary: 150 mg/dL — ABNORMAL HIGH (ref 70–99)
Glucose-Capillary: 136 mg/dL — ABNORMAL HIGH (ref 70–99)

## 2021-08-17 MED ORDER — GABAPENTIN 300 MG PO CAPS
300.0000 mg | ORAL_CAPSULE | Freq: Two times a day (BID) | ORAL | Status: DC
  Administered 2021-08-17: 300 mg via ORAL
  Filled 2021-08-17: qty 1

## 2021-08-17 MED ORDER — TRAZODONE HCL 50 MG PO TABS
100.0000 mg | ORAL_TABLET | Freq: Every day | ORAL | Status: DC
  Administered 2021-08-17: 100 mg via ORAL
  Filled 2021-08-17: qty 2

## 2021-08-17 MED ORDER — BRIMONIDINE TARTRATE 0.2 % OP SOLN
1.0000 [drp] | Freq: Three times a day (TID) | OPHTHALMIC | Status: DC
  Administered 2021-08-17: 1 [drp] via OPHTHALMIC
  Filled 2021-08-17: qty 5

## 2021-08-17 MED ORDER — ALBUTEROL SULFATE (2.5 MG/3ML) 0.083% IN NEBU
10.0000 mg | INHALATION_SOLUTION | Freq: Once | RESPIRATORY_TRACT | Status: AC
  Administered 2021-08-17: 10 mg via RESPIRATORY_TRACT
  Filled 2021-08-17: qty 12

## 2021-08-17 MED ORDER — LATANOPROST 0.005 % OP SOLN
1.0000 [drp] | Freq: Every day | OPHTHALMIC | Status: DC
  Administered 2021-08-17: 1 [drp] via OPHTHALMIC
  Filled 2021-08-17: qty 2.5

## 2021-08-17 MED ORDER — ATORVASTATIN CALCIUM 40 MG PO TABS
40.0000 mg | ORAL_TABLET | Freq: Every day | ORAL | Status: DC
  Administered 2021-08-17: 40 mg via ORAL
  Filled 2021-08-17: qty 1

## 2021-08-17 MED ORDER — SODIUM CHLORIDE 0.9 % IV BOLUS
1000.0000 mL | Freq: Once | INTRAVENOUS | Status: AC
  Administered 2021-08-17: 1000 mL via INTRAVENOUS

## 2021-08-17 MED ORDER — DEXTROSE 50 % IV SOLN
1.0000 | Freq: Once | INTRAVENOUS | Status: AC
  Administered 2021-08-17: 50 mL via INTRAVENOUS
  Filled 2021-08-17: qty 50

## 2021-08-17 MED ORDER — SODIUM BICARBONATE 8.4 % IV SOLN
50.0000 meq | Freq: Once | INTRAVENOUS | Status: AC
Start: 2021-08-17 — End: 2021-08-17

## 2021-08-17 MED ORDER — ENOXAPARIN SODIUM 40 MG/0.4ML IJ SOSY
40.0000 mg | PREFILLED_SYRINGE | INTRAMUSCULAR | Status: DC
  Administered 2021-08-17: 40 mg via SUBCUTANEOUS
  Filled 2021-08-17: qty 0.4

## 2021-08-17 MED ORDER — NETARSUDIL-LATANOPROST 0.02-0.005 % OP SOLN: 1.0000 [drp] | Freq: Every day | OPHTHALMIC | Status: DC

## 2021-08-17 MED ORDER — PANTOPRAZOLE SODIUM 40 MG PO TBEC: 40.0000 mg | DELAYED_RELEASE_TABLET | Freq: Every day | ORAL | Status: DC

## 2021-08-17 MED ORDER — ONDANSETRON HCL 4 MG PO TABS: 4.0000 mg | ORAL_TABLET | Freq: Four times a day (QID) | ORAL | Status: DC | PRN

## 2021-08-17 MED ORDER — INSULIN GLARGINE-YFGN 100 UNIT/ML ~~LOC~~ SOLN
15.0000 [IU] | Freq: Every day | SUBCUTANEOUS | Status: DC
  Administered 2021-08-17: 15 [IU] via SUBCUTANEOUS
  Filled 2021-08-17 (×2): qty 0.15

## 2021-08-17 MED ORDER — SODIUM BICARBONATE 8.4 % IV SOLN
INTRAVENOUS | Status: AC
Start: 2021-08-17 — End: 2021-08-17
  Administered 2021-08-17: 50 meq via INTRAVENOUS
  Filled 2021-08-17: qty 50

## 2021-08-17 MED ORDER — INSULIN ASPART 100 UNIT/ML IJ SOLN: 0.0000 [IU] | Freq: Every day | INTRAMUSCULAR | Status: DC

## 2021-08-17 MED ORDER — FOLIC ACID 1 MG PO TABS
1.0000 mg | ORAL_TABLET | Freq: Every day | ORAL | Status: DC
  Administered 2021-08-17: 1 mg via ORAL
  Filled 2021-08-17: qty 1

## 2021-08-17 MED ORDER — THIAMINE HCL 100 MG PO TABS
100.0000 mg | ORAL_TABLET | Freq: Every day | ORAL | Status: DC
  Administered 2021-08-17: 100 mg via ORAL
  Filled 2021-08-17: qty 1

## 2021-08-17 MED ORDER — ADULT MULTIVITAMIN W/MINERALS CH
1.0000 | ORAL_TABLET | Freq: Every day | ORAL | Status: DC
  Administered 2021-08-17: 1 via ORAL
  Filled 2021-08-17: qty 1

## 2021-08-17 MED ORDER — FAMOTIDINE 20 MG PO TABS
20.0000 mg | ORAL_TABLET | Freq: Two times a day (BID) | ORAL | Status: DC
  Administered 2021-08-17: 20 mg via ORAL
  Filled 2021-08-17: qty 1

## 2021-08-17 MED ORDER — THIAMINE HCL 100 MG/ML IJ SOLN
100.0000 mg | Freq: Every day | INTRAMUSCULAR | Status: DC
  Filled 2021-08-17: qty 2

## 2021-08-17 MED ORDER — ALBUTEROL SULFATE HFA 108 (90 BASE) MCG/ACT IN AERS: 2.0000 | INHALATION_SPRAY | RESPIRATORY_TRACT | Status: DC | PRN

## 2021-08-17 MED ORDER — SODIUM CHLORIDE 0.9 % IV SOLN: INTRAVENOUS | Status: DC

## 2021-08-17 MED ORDER — LORAZEPAM 2 MG/ML IJ SOLN: 1.0000 mg | INTRAMUSCULAR | Status: DC | PRN

## 2021-08-17 MED ORDER — LORAZEPAM 1 MG PO TABS: 1.0000 mg | ORAL_TABLET | ORAL | Status: DC | PRN

## 2021-08-17 MED ORDER — ONDANSETRON HCL 4 MG/2ML IJ SOLN: 4.0000 mg | Freq: Four times a day (QID) | INTRAMUSCULAR | Status: DC | PRN

## 2021-08-17 MED ORDER — CALCIUM GLUCONATE-NACL 1-0.675 GM/50ML-% IV SOLN
1.0000 g | Freq: Once | INTRAVENOUS | Status: AC
  Administered 2021-08-17: 1000 mg via INTRAVENOUS
  Filled 2021-08-17: qty 50

## 2021-08-17 MED ORDER — INSULIN ASPART 100 UNIT/ML IV SOLN
5.0000 [IU] | Freq: Once | INTRAVENOUS | Status: AC
  Administered 2021-08-17: 5 [IU] via INTRAVENOUS

## 2021-08-17 MED ORDER — SODIUM ZIRCONIUM CYCLOSILICATE 5 G PO PACK
10.0000 g | PACK | Freq: Every day | ORAL | Status: DC
Start: 2021-08-17 — End: 2021-08-17
  Administered 2021-08-17: 10 g via ORAL
  Filled 2021-08-17: qty 2

## 2021-08-17 MED ORDER — SODIUM ZIRCONIUM CYCLOSILICATE 5 G PO PACK
10.0000 g | PACK | Freq: Two times a day (BID) | ORAL | Status: DC
  Filled 2021-08-17: qty 2

## 2021-08-17 MED ORDER — LORATADINE 10 MG PO TABS
10.0000 mg | ORAL_TABLET | Freq: Every day | ORAL | Status: DC
  Administered 2021-08-17: 10 mg via ORAL
  Filled 2021-08-17: qty 1

## 2021-08-17 MED ORDER — INSULIN ASPART 100 UNIT/ML IJ SOLN: 0.0000 [IU] | Freq: Three times a day (TID) | INTRAMUSCULAR | Status: DC

## 2021-08-17 MED ORDER — SERTRALINE HCL 50 MG PO TABS: 100.0000 mg | ORAL_TABLET | Freq: Every day | ORAL | Status: DC

## 2021-08-17 NOTE — ED Provider Notes (Signed)
[facility] EMERGENCY DEPARTMENT Provider Note   CSN: [id number] Arrival date & time: 08/17/21  1444     History  Chief Complaint  Patient presents with   Abnormal Lab    Austin Berry is a 56 y.o. male with history of diabetes mellitus, hypertension, elevated liver function test, blindness of both eyes.  Patient presents to the emergency department today with a chief complaint of abnormal lab results.  Patient reports that he had lab testing done 4 days prior and was contacted by by gastroenterologist who ordered lab work due to abnormal kidney function.  Patient states that he has not been drinking or eating as much over the last few days.  Patient also reports that he was started on spironolactone and hydrochlorothiazide 1 month prior.  Patient has not had any labs after starting these medications.  Patient does endorse urinary frequency secondary to spironolactone and hydrochlorothiazide use.  Denies any nausea, vomiting, diarrhea, abdominal pain, dysuria, urinary urgency.     Abnormal Lab     Home Medications Prior to Admission medications   Medication Sig Start Date End Date Taking? Authorizing Provider  acetaminophen (TYLENOL) 325 MG tablet Take 650 mg by mouth every 6 (six) hours as needed for mild pain or headache.    [provider]  ALPHAGAN P 0.1 % SOLN Apply 1 drop to eye 2 (two) times daily. 05/08/21   [provider]  amoxicillin-clavulanate (AUGMENTIN XR) 1000-62.5 MG 12 hr tablet TAKE 1 TABLET BY MOUTH TWICE A DAY FOR 10 DAYS FOR INFECTION 05/03/20   [provider]  atorvastatin (LIPITOR) 40 MG tablet Take 40 mg by mouth daily.    [provider]  Blood Pressure Monitoring (BLOOD PRESSURE KIT) KIT Use 1 kit as directed once a day  to monitor BP. Needs to be a talking BP machine due to patient blindness 05/03/20   [provider]  brimonidine (ALPHAGAN) 0.2 % ophthalmic solution 3 (three) times daily.    [provider]  cetirizine (ZYRTEC) 10 MG tablet TAKE 1 TABLET EVERY DAY FOR ALLERGY. 06/28/20   [provider]  chlordiazePOXIDE (LIBRIUM) 10 MG capsule TAKE ONE CAPSULE BY MOUTH UP TO 3 TIMES PER DAY FOR ALCOHOL CESSATION 05/10/20   [provider]  Disulfiram 500 MG TABS TAKE ONE TABLET BY MOUTH EACH MORNING, START 05/11/20 07/03/20   [provider]  dorzolamide (TRUSOPT) 2 % ophthalmic solution Place 1 drop into both eyes daily.    [provider]  dorzolamide-timolol (COSOPT) 22.3-6.8 MG/ML ophthalmic solution INSTILL 1 DROP IN RIGHT EYE 2 TIMES DAILY 10/08/18   [provider]  famotidine (PEPCID) 20 MG tablet TAKE 1 TABLET BY MOUTH EVERY DAY 10/01/19   [name], MD  fluticasone (FLONASE) 50 MCG/ACT nasal spray 2 SPRAYS TO EACH NOSTRIL DAILY FOR ALLERGIES 05/29/20   [provider]  gabapentin (NEURONTIN) 300 MG capsule Take 300 mg by mouth at bedtime. 05/14/21   [provider]  hydrochlorothiazide (HYDRODIURIL) 25 MG tablet Take 25 mg by mouth every morning. 08/04/21   [provider]  latanoprost (XALATAN) 0.005 % ophthalmic solution Apply to eye.    [provider]  LISINOPRIL PO Take 1 tablet by mouth daily.     [provider]  metFORMIN (GLUCOPHAGE) 500 MG tablet Take 1 tablet (500 mg total) by mouth 2 (two) times daily with a meal. 04/04/15   Carrie Mew, MD  naproxen (NAPROSYN) 500 MG tablet TAKE 1 TAB BY  MOUTH TWICE DAILY AS NEEDED FOR PAIN X 2 4 WEEKS 09/10/18   [provider]  omeprazole (PRILOSEC) 40 MG capsule Take 1 capsule (40 mg total) by mouth daily. 08/13/21   Jonathon Bellows, MD  PROAIR HFA 108 [phone] Base) MCG/ACT inhaler Inhale 2 puffs into the lungs every 4 (four) hours as needed. 07/19/20   [provider]  psyllium (METAMUCIL) 58.6 % packet Take 1 packet by mouth daily.    [provider]  ROCKLATAN 0.02-0.005 % SOLN Place 1 drop into the right eye daily. 07/19/20    [provider]  sertraline (ZOLOFT) 100 MG tablet Take 1 tablet by mouth daily. 07/14/20   [provider]  sulfamethoxazole-trimethoprim (BACTRIM DS) 800-160 MG tablet TAKE 1 TABLET BY MOUTH TWICE A DAY FOR 3 DAYS FOR UTI 05/10/20   [provider]  traZODone (DESYREL) 100 MG tablet TAKE 1 TABLET BY MOUTH AT BEDTIME MAY TAKE 1/2 TO 1 WHOLE TABLET AS NEEDED FOR SLEEP 05/08/20   [provider]  zolpidem (AMBIEN) 5 MG tablet Take 5 mg by mouth at bedtime. 10/21/18   [provider]  Omeprazole (PRILOSEC PO) Take by mouth.  06/07/21  [provider]      Allergies    Tramadol    Review of Systems   Review of Systems  Constitutional:  Negative for chills and fever.  Eyes:  Positive for visual disturbance (blind both eyes).  Respiratory:  Negative for shortness of breath.   Cardiovascular:  Negative for chest pain.  Gastrointestinal:  Negative for abdominal pain, nausea and vomiting.  Genitourinary:  Positive for frequency. Negative for difficulty urinating, dysuria, penile pain, penile swelling, scrotal swelling, testicular pain and urgency.  Musculoskeletal:  Negative for back pain and neck pain.  Skin:  Negative for color change and rash.  Neurological:  Negative for dizziness, syncope, light-headedness and headaches.  Psychiatric/Behavioral:  Negative for confusion.    Physical Exam Updated Vital Signs BP 132/79 (BP Location: Right Arm)   Pulse 68   Temp 98.3 F (36.8 C) (Oral)   Resp 18   Ht 6' (1.829 m)   SpO2 100%   BMI 26.34 kg/m  Physical Exam Vitals and nursing note reviewed.  Constitutional:      General: He is not in acute distress.    Appearance: He is not ill-appearing, toxic-appearing or diaphoretic.  HENT:     Head: Normocephalic.  Eyes:     General: No scleral icterus.       Right eye: No discharge.        Left eye: No discharge.  Cardiovascular:     Rate and Rhythm: Normal rate.  Pulmonary:     Effort:  Pulmonary effort is normal.  Skin:    General: Skin is warm and dry.  Neurological:     General: No focal deficit present.     Mental Status: He is alert.  Psychiatric:        Behavior: Behavior is cooperative.    ED Results / Procedures / Treatments   Labs (all labs ordered are listed, but only abnormal results are displayed) Labs Reviewed  COMPREHENSIVE METABOLIC PANEL - Abnormal; Notable for the following components:      Result Value   Potassium 6.4 (*)    Glucose, Bld 137 (*)    BUN 31 (*)    Creatinine, Ser 2.05 (*)    AST 78 (*)    ALT 72 (*)    GFR, Estimated 37 (*)  All other components within normal limits  CBC WITH DIFFERENTIAL/PLATELET - Abnormal; Notable for the following components:   RBC 3.66 (*)    Hemoglobin 12.8 (*)    HCT 37.9 (*)    MCV 103.6 (*)    MCH 35.0 (*)    Monocytes Absolute 1.1 (*)    Abs Immature Granulocytes 0.09 (*)    All other components within normal limits  URINALYSIS, ROUTINE W REFLEX MICROSCOPIC    EKG None  Radiology No results found.  Procedures .Critical Care Performed by: Loni Beckwith, PA-C Authorized by: Loni Beckwith, PA-C   Critical care provider statement:    Critical care time (minutes):  30   Critical care was necessary to treat or prevent imminent or life-threatening deterioration of the following conditions:  Metabolic crisis   Critical care was time spent personally by me on the following activities:  Development of treatment plan with patient or surrogate, discussions with consultants, evaluation of patient's response to treatment, examination of patient, ordering and review of laboratory studies, ordering and review of radiographic studies, ordering and performing treatments and interventions, pulse oximetry, re-evaluation of patient's condition and review of old charts Comments:     Hyperkalemia and AKI    Medications Ordered in ED Medications  sodium chloride 0.9 % bolus 1,000 mL (has no  administration in time range)    ED Course/ Medical Decision Making/ A&P Clinical Course as of 08/17/21 1601  Fri Aug 17, 2021  1554 I spoke with lab technician Marya Amsler who reported that the sample had no evidence of hemolysis.  We will treat patient for hyperkalemia at this time.  Patient will need admission for AKI with hyperkalemia. [PB]    Clinical Course User Index [PB] Loni Beckwith, PA-C                           Medical Decision Making Amount and/or Complexity of Data Reviewed Labs: ordered.  Risk OTC drugs. Prescription drug management. Decision regarding hospitalization.   Alert 56 year old male in no acute stress, nontoxic-appearing.  Presents to the emergency department with a chief complaint of elevated kidney function.  Information obtained from patient and patient's girlfriend at bedside.  Past medical records were reviewed including previous provider notes, labs, and imaging.  Patient has medical history as outlined in HPI which complicates her care.  Per chart review patient had a creatinine of 2.08; lab result obtained 1 year prior was within normal limits.  BUN 31 on lab testing done 4 days prior.    I suspect that patient's elevated creatinine is secondary to decreased p.o. intake and hydrochlorothiazide use.  Patient has no history of heart failure, will give 1 L fluid bolus at this time.  Plan to recheck labs and urinalysis.  I was contacted by RN to report critical potassium value of 6.4.  I spoke with lab technician who verified no hemolysis in patient's sample.  Will obtain EKG at this time.  Will initiate Lokelma, albuterol, and insulin to help reduce patient's potassium.  We will give patient sodium bicarb and calcium gluconate as a precaution.  I personally viewed and interpreted patient's EKG.  Tracing shows sinus rhythm with no changes consistent with hyperkalemia.  I personally viewed interpret patient's lab results.  Pertinent findings  include: -Transaminitis slightly above patient's baseline -Hypokalemia with potassium of 6.4 -AKI with creatinine 2.05 and BUN 31  We will consult hospitalist team for admission for hyperkalemia and  AKI.  I spoke to Dr. Nehemiah Settle who will see the patient for admission        Final Clinical Impression(s) / ED Diagnoses Final diagnoses:  Hyperkalemia  AKI (acute kidney injury) [facility])    Rx / DC Orders ED Discharge Orders     None         Dyann Ruddle 08/17/21 1800    Davonna Belling, MD 08/18/21 0700

## 2021-08-17 NOTE — ED Notes (Signed)
Lab at bedside

## 2021-08-17 NOTE — ED Notes (Signed)
Pt ambulated to the bathroom with moderate assist. Pt resting in bed at this time

## 2021-08-17 NOTE — ED Triage Notes (Signed)
Pt sent here by GI MD re: abnormal kidney functions, per family  pt has reflux and was noted to have elevated liver enzymes and kidney function labs, pt ambulatory, A&O x4

## 2021-08-17 NOTE — ED Notes (Signed)
Called [facility] to bring pt medications from main pharmacy
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
## 2021-08-18 DIAGNOSIS — E875 Hyperkalemia: Principal | ICD-10-CM

## 2021-08-18 LAB — BASIC METABOLIC PANEL
Anion gap: 9 (ref 5–15)
BUN: 26 mg/dL — ABNORMAL HIGH (ref 6–20)
CO2: 22 mmol/L (ref 22–32)
Calcium: 9.1 mg/dL (ref 8.9–10.3)
Chloride: 108 mmol/L (ref 98–111)
Creatinine, Ser: 1.66 mg/dL — ABNORMAL HIGH (ref 0.61–1.24)
GFR, Estimated: 48 mL/min — ABNORMAL LOW (ref >60)
Glucose, Bld: 127 mg/dL — ABNORMAL HIGH (ref 70–99)
Potassium: 4.5 mmol/L (ref 3.5–5.1)
Sodium: 139 mmol/L (ref 135–145)

## 2021-08-18 LAB — HIV ANTIBODY (ROUTINE TESTING W REFLEX): HIV Screen 4th Generation wRfx: NONREACTIVE

## 2021-08-18 LAB — CBG MONITORING, ED: Glucose-Capillary: 149 mg/dL — ABNORMAL HIGH (ref 70–99)

## 2021-08-18 MED ORDER — HYDRALAZINE HCL 20 MG/ML IJ SOLN: 10.0000 mg | INTRAMUSCULAR | Status: DC | PRN

## 2021-08-18 MED ORDER — HYDRALAZINE HCL 25 MG PO TABS
25.0000 mg | ORAL_TABLET | Freq: Three times a day (TID) | ORAL | Status: DC
  Administered 2021-08-18: 25 mg via ORAL
  Filled 2021-08-18: qty 1

## 2021-08-18 MED ORDER — HYDRALAZINE HCL 25 MG PO TABS: 25.0000 mg | ORAL_TABLET | Freq: Three times a day (TID) | ORAL | 0 refills | Status: AC

## 2021-08-18 MED ORDER — THIAMINE HCL 100 MG PO TABS: 100.0000 mg | ORAL_TABLET | Freq: Every day | ORAL | 0 refills | Status: AC

## 2021-08-18 MED ORDER — DISULFIRAM 250 MG PO TABS
250.0000 mg | ORAL_TABLET | Freq: Every day | ORAL | 0 refills | Status: AC
Start: 2021-08-18 — End: 2021-09-01

## 2021-08-18 NOTE — ED Notes (Signed)
Dr. Manuella Ghazi at bedside states that the patient will be discharged home.  Contacted nurse upstairs to let her know.
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
## 2021-08-18 NOTE — ED Notes (Signed)
Pt's eye drops are in med holding

## 2021-08-20 ENCOUNTER — Telehealth: Payer: Self-pay | Admitting: Gastroenterology

## 2021-08-20 NOTE — Telephone Encounter (Signed)
Patient was in ED on 08/17/2021. Patient's friend called to see if she needs to reschedule pt's Upper EGD. Requesting call back.

## 2021-08-22 ENCOUNTER — Encounter: Admission: RE | Payer: Self-pay | Source: Home / Self Care

## 2021-08-22 ENCOUNTER — Ambulatory Visit: Admission: RE | Admit: 2021-08-22 | Payer: Medicaid Other | Source: Home / Self Care | Admitting: Gastroenterology

## 2021-08-22 SURGERY — ESOPHAGOGASTRODUODENOSCOPY (EGD) WITH PROPOFOL
Anesthesia: General

## 2021-08-24 NOTE — Telephone Encounter (Signed)
Dr. Tobi Bastos, can you please let me know if we could reschedule his EGD or not. Thank you.

## 2021-08-28 NOTE — Telephone Encounter (Signed)
Dr. Tobi Bastos, patient stated that he was told by you that he would need an EGD and a colonoscopy. Please advise.
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
## 2021-09-03 ENCOUNTER — Other Ambulatory Visit (HOSPITAL_COMMUNITY)
Admission: RE | Admit: 2021-09-03 | Discharge: 2021-09-03 | Disposition: A | Discharge status: Home / Self Care | Payer: Medicaid Other | Source: Ambulatory Visit | Attending: Internal Medicine | Admitting: Internal Medicine

## 2021-09-03 DIAGNOSIS — R945 Abnormal results of liver function studies: Secondary | ICD-10-CM | POA: Diagnosis present

## 2021-09-03 LAB — BASIC METABOLIC PANEL
Anion gap: 6 (ref 5–15)
BUN: 23 mg/dL — ABNORMAL HIGH (ref 6–20)
CO2: 24 mmol/L (ref 22–32)
Calcium: 9.4 mg/dL (ref 8.9–10.3)
Chloride: 106 mmol/L (ref 98–111)
Creatinine, Ser: 1.68 mg/dL — ABNORMAL HIGH (ref 0.61–1.24)
GFR, Estimated: 47 mL/min — ABNORMAL LOW (ref >60)
Glucose, Bld: 158 mg/dL — ABNORMAL HIGH (ref 70–99)
Potassium: 5.2 mmol/L — ABNORMAL HIGH (ref 3.5–5.1)
Sodium: 136 mmol/L (ref 135–145)

## 2021-09-05 ENCOUNTER — Ambulatory Visit: Payer: Medicaid Other | Admitting: Gastroenterology

## 2021-09-12 ENCOUNTER — Other Ambulatory Visit: Payer: Self-pay

## 2021-09-12 DIAGNOSIS — Z1211 Encounter for screening for malignant neoplasm of colon: Secondary | ICD-10-CM

## 2021-09-12 DIAGNOSIS — K219 Gastro-esophageal reflux disease without esophagitis: Secondary | ICD-10-CM

## 2021-09-12 MED ORDER — NA SULFATE-K SULFATE-MG SULF 17.5-3.13-1.6 GM/177ML PO SOLN: 354.0000 mL | Freq: Once | ORAL | 0 refills | Status: AC

## 2021-09-12 NOTE — Addendum Note (Signed)
Addended by: Adela Ports on: 09/12/2021 01:58 PM   Modules accepted: Orders
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
## 2021-09-25 ENCOUNTER — Encounter: Payer: Self-pay | Admitting: Gastroenterology

## 2021-09-26 ENCOUNTER — Ambulatory Visit
Admission: RE | Admit: 2021-09-26 | Discharge: 2021-09-26 | Disposition: A | Discharge status: Home / Self Care | Payer: Medicaid Other | Attending: Gastroenterology | Admitting: Gastroenterology

## 2021-09-26 ENCOUNTER — Encounter: Payer: Self-pay | Admitting: Gastroenterology

## 2021-09-26 ENCOUNTER — Ambulatory Visit: Payer: Medicaid Other | Admitting: Anesthesiology

## 2021-09-26 ENCOUNTER — Encounter
Admission: RE | Disposition: A | Discharge status: Home / Self Care | Payer: Self-pay | Source: Home / Self Care | Attending: Gastroenterology

## 2021-09-26 DIAGNOSIS — Z79899 Other long term (current) drug therapy: Secondary | ICD-10-CM | POA: Diagnosis not present

## 2021-09-26 DIAGNOSIS — K219 Gastro-esophageal reflux disease without esophagitis: Secondary | ICD-10-CM | POA: Diagnosis not present

## 2021-09-26 DIAGNOSIS — R110 Nausea: Secondary | ICD-10-CM | POA: Insufficient documentation

## 2021-09-26 DIAGNOSIS — Z1211 Encounter for screening for malignant neoplasm of colon: Secondary | ICD-10-CM | POA: Diagnosis present

## 2021-09-26 DIAGNOSIS — Z7984 Long term (current) use of oral hypoglycemic drugs: Secondary | ICD-10-CM | POA: Diagnosis not present

## 2021-09-26 DIAGNOSIS — F419 Anxiety disorder, unspecified: Secondary | ICD-10-CM | POA: Insufficient documentation

## 2021-09-26 DIAGNOSIS — E119 Type 2 diabetes mellitus without complications: Secondary | ICD-10-CM | POA: Diagnosis not present

## 2021-09-26 DIAGNOSIS — I10 Essential (primary) hypertension: Secondary | ICD-10-CM | POA: Insufficient documentation

## 2021-09-26 DIAGNOSIS — F17210 Nicotine dependence, cigarettes, uncomplicated: Secondary | ICD-10-CM | POA: Diagnosis not present

## 2021-09-26 HISTORY — PX: ESOPHAGOGASTRODUODENOSCOPY (EGD) WITH PROPOFOL: SHX5813

## 2021-09-26 HISTORY — PX: COLONOSCOPY: SHX5424

## 2021-09-26 LAB — GLUCOSE, CAPILLARY: Glucose-Capillary: 189 mg/dL — ABNORMAL HIGH (ref 70–99)

## 2021-09-26 SURGERY — ESOPHAGOGASTRODUODENOSCOPY (EGD) WITH PROPOFOL
Anesthesia: General

## 2021-09-26 MED ORDER — GLYCOPYRROLATE 0.2 MG/ML IJ SOLN
INTRAMUSCULAR | Status: DC | PRN
  Administered 2021-09-26: .2 mg via INTRAVENOUS

## 2021-09-26 MED ORDER — LIDOCAINE HCL (CARDIAC) PF 100 MG/5ML IV SOSY
PREFILLED_SYRINGE | INTRAVENOUS | Status: DC | PRN
  Administered 2021-09-26: 100 mg via INTRAVENOUS

## 2021-09-26 MED ORDER — SODIUM CHLORIDE 0.9 % IV SOLN
INTRAVENOUS | Status: DC
  Administered 2021-09-26: 20 mL/h via INTRAVENOUS

## 2021-09-26 MED ORDER — PROPOFOL 500 MG/50ML IV EMUL
INTRAVENOUS | Status: DC | PRN
  Administered 2021-09-26: 160 ug/kg/min via INTRAVENOUS

## 2021-09-26 MED ORDER — PROPOFOL 10 MG/ML IV BOLUS
INTRAVENOUS | Status: DC | PRN
  Administered 2021-09-26: 50 mg via INTRAVENOUS
  Administered 2021-09-26: 100 mg via INTRAVENOUS
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
## 2021-09-26 NOTE — Op Note (Signed)
[facility] Gastroenterology Patient Name: Austin Berry Procedure Date: 09/26/2021 10:25 AM MRN: [id number] Account #: [id number] Date of Birth: 1965/10/02 Admit Type: Outpatient Age: 56 Room: [facility] ENDO ROOM 3 Gender: Male Note Status: Finalized Instrument Name: Upper Endoscope [id number] Procedure:             Upper GI endoscopy Indications:           Nausea Providers:             [name] MD, MD Medicines:             Monitored Anesthesia Care Complications:         No immediate complications. Procedure:             Pre-Anesthesia Assessment:                        - Prior to the procedure, a History and Physical was                         performed, and patient medications, allergies and                         sensitivities were reviewed. The patient's tolerance                         of previous anesthesia was reviewed.                        - The risks and benefits of the procedure and the                         sedation options and risks were discussed with the                         patient. All questions were answered and informed                         consent was obtained.                        - ASA Grade Assessment: II - A patient with mild                         systemic disease.                        After obtaining informed consent, the endoscope was                         passed under direct vision. Throughout the procedure,                         the patient's blood pressure, pulse, and oxygen                         saturations were monitored continuously. The Endoscope                         was introduced through the mouth, and advanced to the  third part of duodenum. The upper GI endoscopy was                         accomplished with ease. The patient tolerated the                         procedure well. Findings:      The esophagus was normal.      The stomach was normal.      The examined duodenum  was normal. Impression:            - Normal esophagus.                        - Normal stomach.                        - Normal examined duodenum.                        - No specimens collected. Recommendation:        - Perform a colonoscopy today. Procedure Code(s):     --- Professional ---                        [phone], Esophagogastroduodenoscopy, flexible,                         transoral; diagnostic, including collection of                         specimen(s) by brushing or washing, when performed                         (separate procedure) Diagnosis Code(s):     --- Professional ---                        R11.0, Nausea CPT copyright 2019 [employer] Medical [employer]. All rights reserved. The codes documented in this report are preliminary and upon coder review may  be revised to meet current compliance requirements. [name], MD [name] MD, MD 09/26/2021 10:37:43 AM This report has been signed electronically. Number of Addenda: 0 Note Initiated On: 09/26/2021 10:25 AM Estimated Blood Loss:  Estimated blood loss: none.      [facility]
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
## 2021-09-26 NOTE — Op Note (Signed)
[facility] Gastroenterology Patient Name: Austin Berry Procedure Date: 09/26/2021 10:23 AM MRN: [id number] Account #: [id number] Date of Birth: 10/01/1965 Admit Type: Outpatient Age: 56 Room: [facility] ENDO ROOM 3 Gender: Male Note Status: Finalized Instrument Name: Prentice Docker [id number] Procedure:             Colonoscopy Indications:           Screening for colorectal malignant neoplasm,                         inadequate bowel prep on last colonoscopy (more recent                         than 10 years ago) Providers:             [name] MD, MD Medicines:             Monitored Anesthesia Care Complications:         No immediate complications. Procedure:             Pre-Anesthesia Assessment:                        - Prior to the procedure, a History and Physical was                         performed, and patient medications, allergies and                         sensitivities were reviewed. The patient's tolerance                         of previous anesthesia was reviewed.                        - The risks and benefits of the procedure and the                         sedation options and risks were discussed with the                         patient. All questions were answered and informed                         consent was obtained.                        - ASA Grade Assessment: II - A patient with mild                         systemic disease.                        After obtaining informed consent, the colonoscope was                         passed under direct vision. Throughout the procedure,                         the patient's blood pressure, pulse, and oxygen  saturations were monitored continuously. The                         Colonoscope was introduced through the anus and                         advanced to the the cecum, identified by the                         appendiceal orifice. The colonoscopy was performed                          with ease. The patient tolerated the procedure well.                         The quality of the bowel preparation was poor. Findings:      The perianal and digital rectal examinations were normal.      A large amount of semi-liquid stool was found in the entire colon,       interfering with visualization. Impression:            - Preparation of the colon was poor.                        - Stool in the entire examined colon.                        - No specimens collected. Recommendation:        - Discharge patient to home (with escort).                        - Resume previous diet.                        - Continue present medications.                        - Repeat colonoscopy in 4 weeks because the bowel                         preparation was suboptimal.                        - 2 day prep Procedure Code(s):     --- Professional ---                        [phone], Colonoscopy, flexible; diagnostic, including                         collection of specimen(s) by brushing or washing, when                         performed (separate procedure) Diagnosis Code(s):     --- Professional ---                        Z12.11, Encounter for screening for malignant neoplasm                         of colon CPT copyright 2019 [employer]. All rights reserved. The codes documented in this report are preliminary and upon coder review may  be revised to meet current compliance requirements. [name], MD [name] MD, MD 09/26/2021 10:43:40 AM This report has been signed electronically. Number of Addenda: 0 Note Initiated On: 09/26/2021 10:23 AM Scope Withdrawal Time: 0 hours 1 minute 36 seconds  Total Procedure Duration: 0 hours 2 minutes 58 seconds  Estimated Blood Loss:  Estimated blood loss: none.      [facility]

## 2021-09-26 NOTE — Transfer of Care (Signed)
Immediate Anesthesia Transfer of Care Note  Patient: Austin Berry  Procedure(s) Performed: ESOPHAGOGASTRODUODENOSCOPY (EGD) WITH PROPOFOL COLONOSCOPY  Patient Location: PACU and Endoscopy Unit  Anesthesia Type:General  Level of Consciousness: drowsy  Airway & Oxygen Therapy: Patient Spontanous Breathing  Post-op Assessment: Report given to RN and Post -op Vital signs reviewed and stable  Post vital signs: Reviewed and stable  Last Vitals:  Vitals Value Taken Time  BP 122/90 09/26/21 1046  Temp 36.1 C 09/26/21 1046  Pulse 99 09/26/21 1047  Resp 20 09/26/21 1048  SpO2 96 % 09/26/21 1047  Vitals shown include unvalidated device data.  Last Pain:  Vitals:   09/26/21 1046  TempSrc: Temporal  PainSc:          Complications: No notable events documented.

## 2021-09-27 ENCOUNTER — Telehealth: Payer: Self-pay

## 2021-09-27 ENCOUNTER — Encounter: Payer: Self-pay | Admitting: Gastroenterology

## 2021-09-27 NOTE — Telephone Encounter (Signed)
Called patient and he did not answer his phone and his mailbox is full so I couldn't leave him a message. I will try to call him again later on the day.

## 2021-09-28 ENCOUNTER — Telehealth: Payer: Self-pay | Admitting: Acute Care

## 2021-09-28 NOTE — Telephone Encounter (Signed)
Called patient again and I was not able to leave a voicemail. I will send him a letter letting know that he needs to call us so we could reschedule his colonoscopy.   Repeat colonoscopy in 4 weeks because the bowel preparation was suboptimal. (Approx. 07/28/22023)

## 2021-09-28 NOTE — Telephone Encounter (Signed)
Spoke with pt to schedule annual LDCT, but patient requested a call back in August after holidays and for him to be able to arrange transportation.

## 2021-09-30 IMAGING — CT CT CHEST LUNG CANCER SCREENING LOW DOSE W/O CM
2 of 5 series · 15 of 40 positions shown, 18 images · non-contrast
Comparison: 01/27/2003 chest CT.

CLINICAL DATA: 54-year-old asymptomatic male current smoker with 30
pack-year smoking history.

EXAM:
CT CHEST WITHOUT CONTRAST LOW-DOSE FOR LUNG CANCER SCREENING
TECHNIQUE: Multidetector CT imaging of the chest was performed following the
standard protocol without IV contrast.

[Series 3: lung 1.00 · axial · 0.81mm/px · z∈[-1197,-909]mm · 12 of 318 slices shown, 15 images]
[im 15/318  mediastinal]
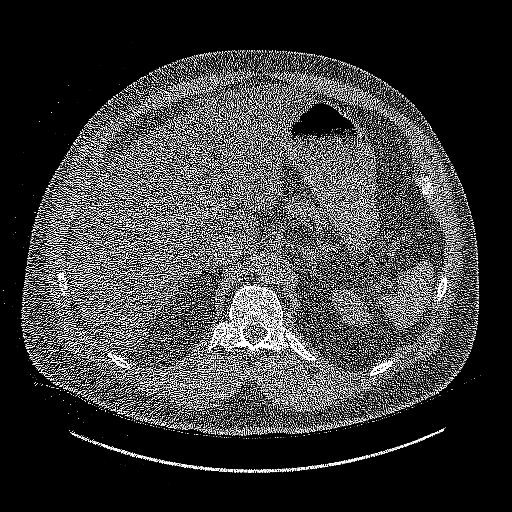
[im 15/318  lung]
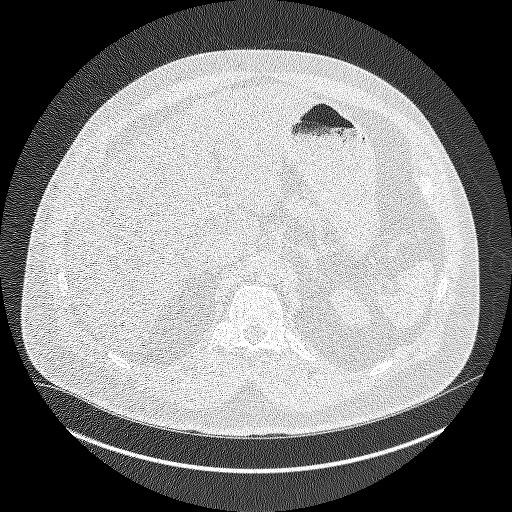
[im 44/318  lung]
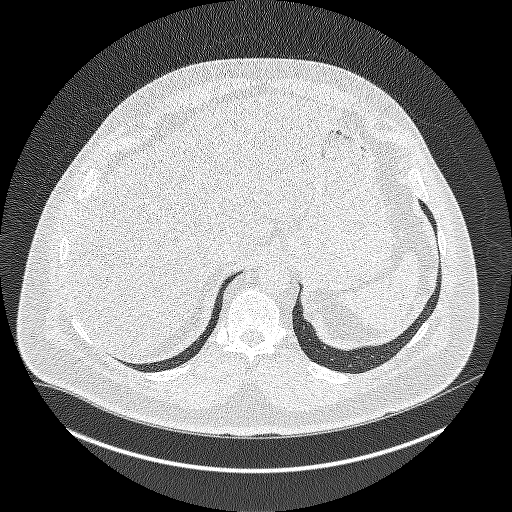
[im 73/318  lung]
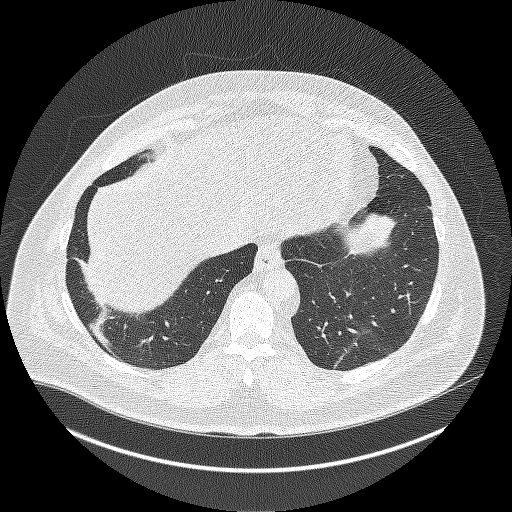
[im 101/318  lung]
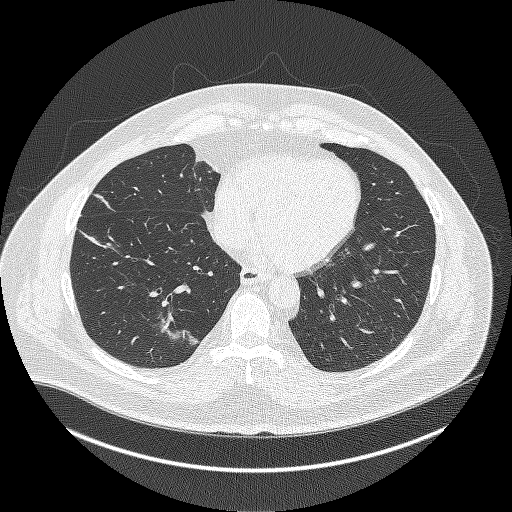
[im 116/318  mediastinal]
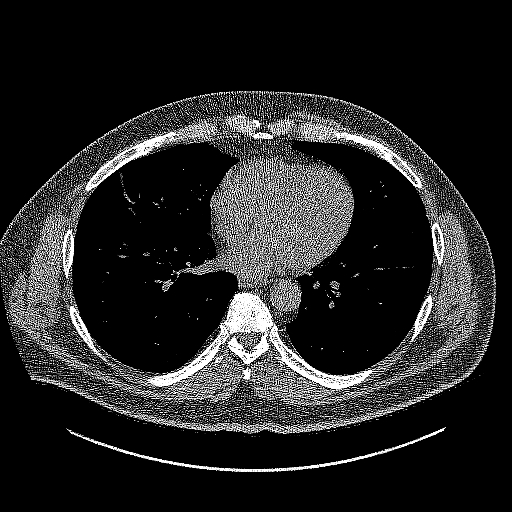
[im 116/318  lung]
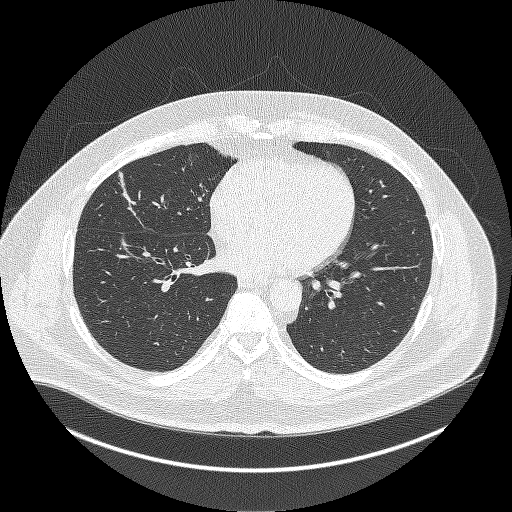
[im 145/318  lung]
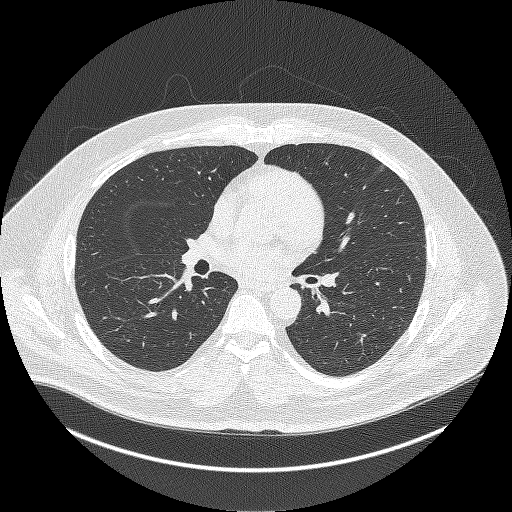
[im 173/318  lung]
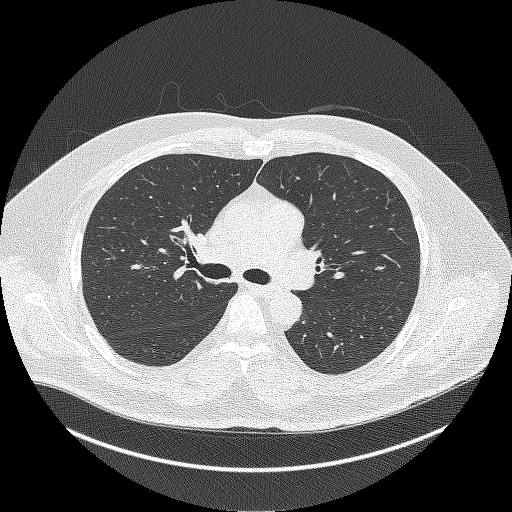
[im 202/318  lung]
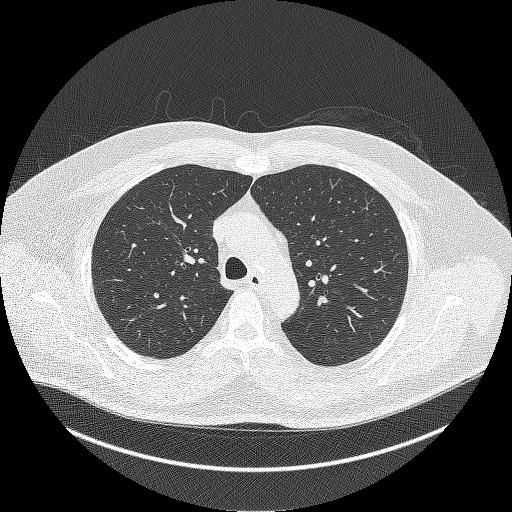
[im 217/318  mediastinal]
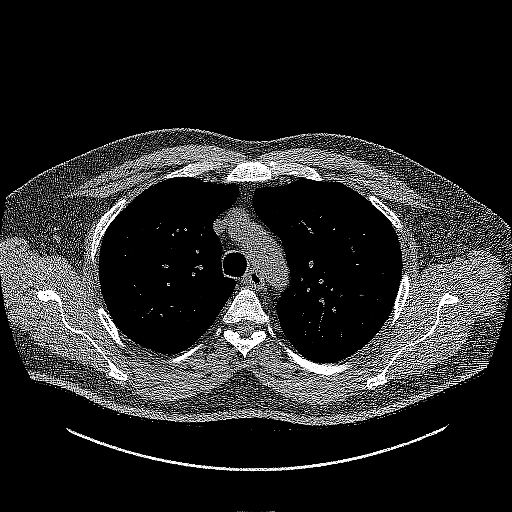
[im 217/318  lung]
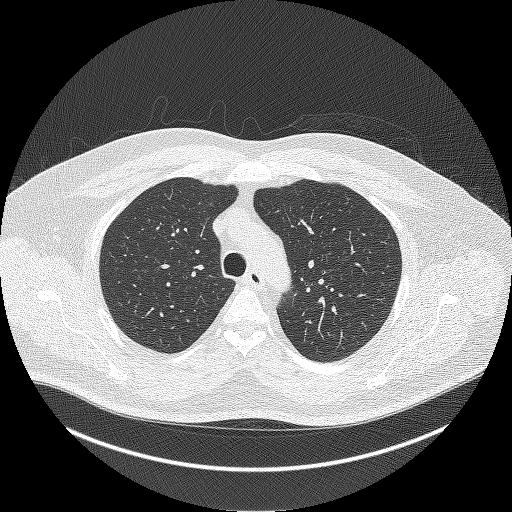
[im 245/318  lung]
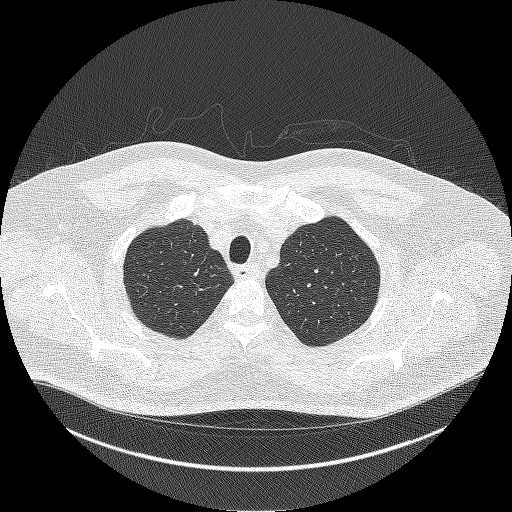
[im 274/318  lung]
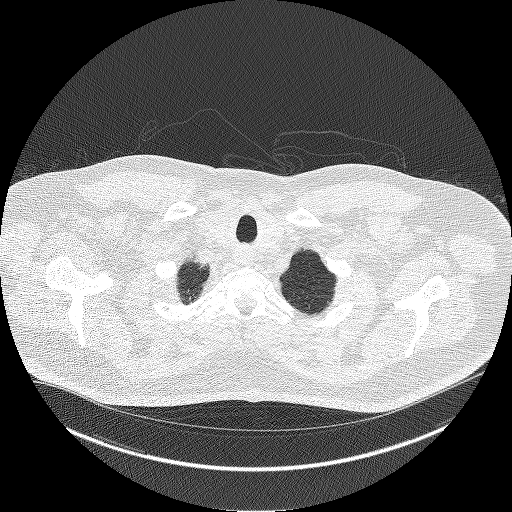
[im 303/318  lung]
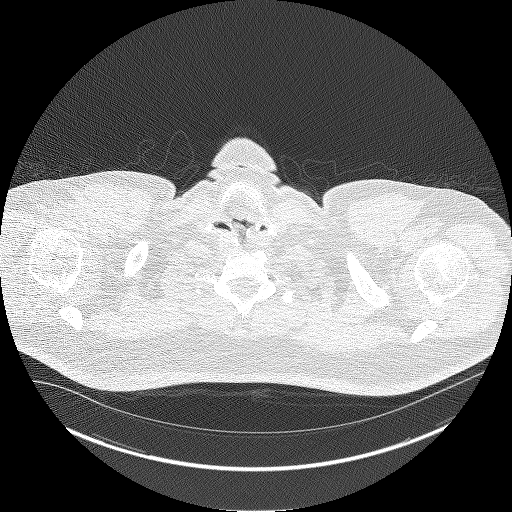

[Series 5: coronals lung 1.00 cor · coronal · 0.62mm/px · 3 of 355 slices shown]
[im 71/355  lung]
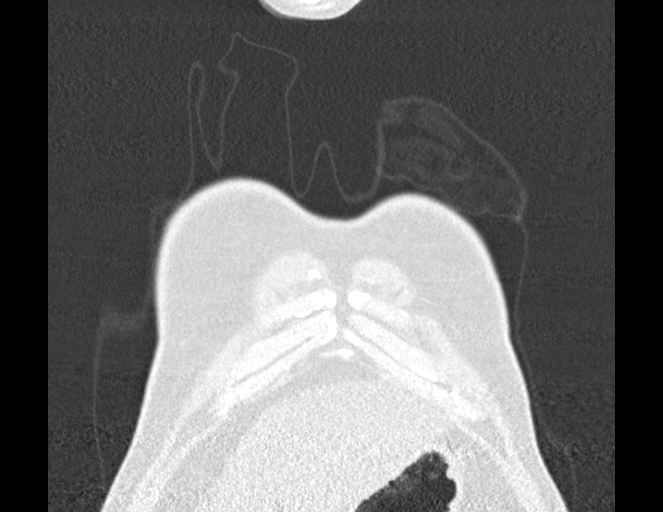
[im 142/355  lung]
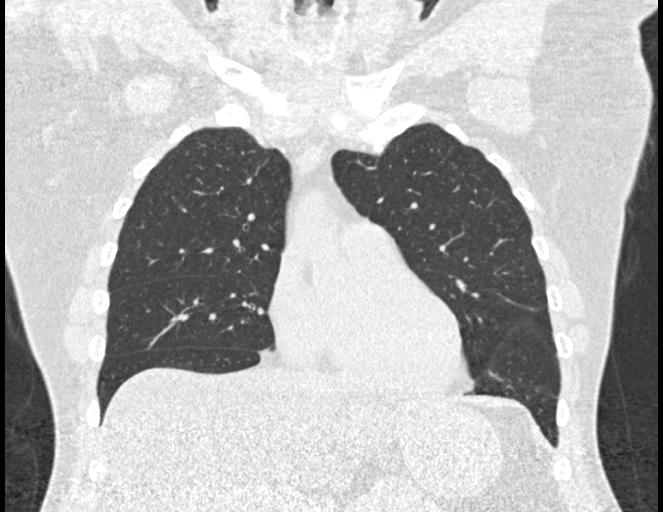
[im 213/355  lung]
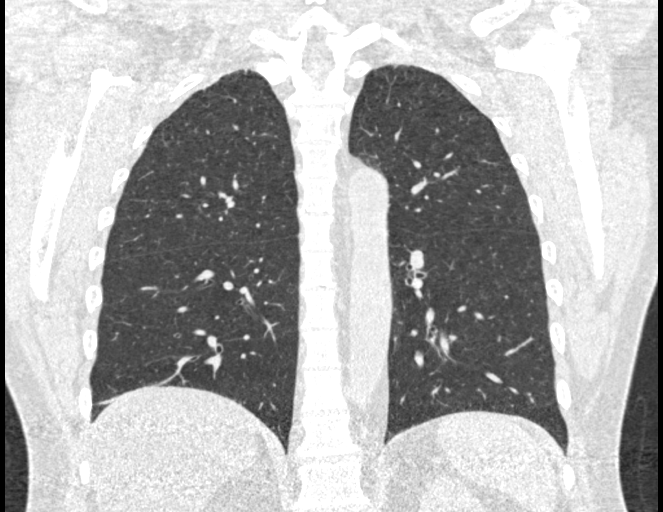

[15 of 40 positions shown; findings below may reference images not displayed]

FINDINGS: Cardiovascular: Normal heart size. No significant pericardial
effusion/thickening. Left anterior descending coronary
atherosclerosis. Atherosclerotic nonaneurysmal thoracic aorta.
Normal caliber pulmonary arteries.

Mediastinum/Nodes: No discrete thyroid nodules. Unremarkable
esophagus. No pathologically enlarged axillary, mediastinal or hilar
lymph nodes, noting limited sensitivity for the detection of hilar
adenopathy on this noncontrast study.

Lungs/Pleura: No pneumothorax. No pleural effusion. Mild
centrilobular emphysema with mild diffuse bronchial wall thickening.
No acute consolidative airspace disease or lung masses. No
significant pulmonary nodules.

Upper abdomen: Diffuse hepatic steatosis.

Musculoskeletal: No aggressive appearing focal osseous lesions. Mild
thoracic spondylosis.
IMPRESSION: 1. Lung-RADS 1, negative. Continue annual screening with low-dose
chest CT without contrast in 12 months.
2. One vessel coronary atherosclerosis.
3. Diffuse hepatic steatosis.
4. Aortic Atherosclerosis ([id number]) and Emphysema ([id number].T).

## 2021-10-02 NOTE — Anesthesia Postprocedure Evaluation (Addendum)
Anesthesia Post Note  Patient: Austin Berry  Procedure(s) Performed: ESOPHAGOGASTRODUODENOSCOPY (EGD) WITH PROPOFOL COLONOSCOPY  Patient location during evaluation: Endoscopy Anesthesia Type: General Level of consciousness: awake and alert Pain management: pain level controlled Vital Signs Assessment: post-procedure vital signs reviewed and stable Respiratory status: spontaneous breathing, nonlabored ventilation, respiratory function stable and patient connected to nasal cannula oxygen Cardiovascular status: blood pressure returned to baseline and stable Postop Assessment: no apparent nausea or vomiting Anesthetic complications: no   No notable events documented.   Last Vitals:  Vitals:   09/26/21 1056 09/26/21 1106  BP:    Pulse:    Resp:    Temp:    SpO2: 98% 100%    Last Pain:  Vitals:   09/26/21 1106  TempSrc:   PainSc: 0-No pain                 Lenard Simmer

## 2021-10-12 ENCOUNTER — Telehealth: Payer: Self-pay

## 2021-10-12 NOTE — Telephone Encounter (Signed)
Returned phone call no answer no voicemail

## 2021-10-23 ENCOUNTER — Other Ambulatory Visit: Payer: Self-pay

## 2021-10-23 ENCOUNTER — Telehealth: Payer: Self-pay

## 2021-10-23 DIAGNOSIS — Z1211 Encounter for screening for malignant neoplasm of colon: Secondary | ICD-10-CM

## 2021-10-23 MED ORDER — GOLYTELY 236 G PO SOLR: ORAL | 0 refills | Status: DC

## 2021-10-23 NOTE — Telephone Encounter (Signed)
Patients friend Jacquline ([employer] Check[employer]) contacted office to schedule patient for his repeat colonoscopy due to poor prep.  He has been scheduled for 11/28/21 with Dr. Tobi Bastos.  2 day prep using Golytely has been sent to pharmacy.  Thanks, [place], [place]

## 2021-11-28 ENCOUNTER — Ambulatory Visit: Payer: Medicaid Other | Admitting: Anesthesiology

## 2021-11-28 ENCOUNTER — Ambulatory Visit
Admission: RE | Admit: 2021-11-28 | Discharge: 2021-11-28 | Disposition: A | Discharge status: Home / Self Care | Payer: Medicaid Other | Attending: Gastroenterology | Admitting: Gastroenterology

## 2021-11-28 ENCOUNTER — Encounter
Admission: RE | Disposition: A | Discharge status: Home / Self Care | Payer: Self-pay | Source: Home / Self Care | Attending: Gastroenterology

## 2021-11-28 DIAGNOSIS — E119 Type 2 diabetes mellitus without complications: Secondary | ICD-10-CM | POA: Insufficient documentation

## 2021-11-28 DIAGNOSIS — Z7984 Long term (current) use of oral hypoglycemic drugs: Secondary | ICD-10-CM | POA: Insufficient documentation

## 2021-11-28 DIAGNOSIS — K219 Gastro-esophageal reflux disease without esophagitis: Secondary | ICD-10-CM | POA: Insufficient documentation

## 2021-11-28 DIAGNOSIS — Z1211 Encounter for screening for malignant neoplasm of colon: Secondary | ICD-10-CM | POA: Insufficient documentation

## 2021-11-28 DIAGNOSIS — I10 Essential (primary) hypertension: Secondary | ICD-10-CM | POA: Diagnosis not present

## 2021-11-28 HISTORY — PX: COLONOSCOPY WITH PROPOFOL: SHX5780

## 2021-11-28 LAB — GLUCOSE, CAPILLARY: Glucose-Capillary: 151 mg/dL — ABNORMAL HIGH (ref 70–99)

## 2021-11-28 SURGERY — COLONOSCOPY WITH PROPOFOL
Anesthesia: General

## 2021-11-28 MED ORDER — KETAMINE HCL 50 MG/5ML IJ SOSY
PREFILLED_SYRINGE | INTRAMUSCULAR | Status: AC
  Filled 2021-11-28: qty 5

## 2021-11-28 MED ORDER — PROPOFOL 500 MG/50ML IV EMUL
INTRAVENOUS | Status: DC | PRN
  Administered 2021-11-28: 150 ug/kg/min via INTRAVENOUS

## 2021-11-28 MED ORDER — LIDOCAINE HCL (CARDIAC) PF 100 MG/5ML IV SOSY
PREFILLED_SYRINGE | INTRAVENOUS | Status: DC | PRN
  Administered 2021-11-28: 25 mg via INTRAVENOUS

## 2021-11-28 MED ORDER — SODIUM CHLORIDE 0.9 % IV SOLN: INTRAVENOUS | Status: DC | PRN

## 2021-11-28 MED ORDER — KETAMINE HCL 10 MG/ML IJ SOLN
INTRAMUSCULAR | Status: DC | PRN
  Administered 2021-11-28: 20 mg via INTRAVENOUS

## 2021-11-28 MED ORDER — PROPOFOL 10 MG/ML IV BOLUS
INTRAVENOUS | Status: DC | PRN
  Administered 2021-11-28 (×2): 50 mg via INTRAVENOUS

## 2021-11-28 MED ORDER — MIDAZOLAM HCL 2 MG/2ML IJ SOLN
INTRAMUSCULAR | Status: DC | PRN
  Administered 2021-11-28: 2 mg via INTRAVENOUS

## 2021-11-28 MED ORDER — SODIUM CHLORIDE 0.9 % IV SOLN
INTRAVENOUS | Status: DC
  Administered 2021-11-28: 20 mL/h via INTRAVENOUS

## 2021-11-28 MED ORDER — PROPOFOL 1000 MG/100ML IV EMUL
INTRAVENOUS | Status: AC
  Filled 2021-11-28: qty 100

## 2021-11-28 MED ORDER — MIDAZOLAM HCL 2 MG/2ML IJ SOLN
INTRAMUSCULAR | Status: AC
  Filled 2021-11-28: qty 2

## 2021-11-28 NOTE — Op Note (Signed)
[facility] Gastroenterology Patient Name: Austin Berry Procedure Date: 11/28/2021 8:43 AM MRN: [id number] Account #: [id number] Date of Birth: 02/10/1966 Admit Type: Outpatient Age: 56 Room: [facility] ENDO ROOM 3 Gender: Male Note Status: Finalized Instrument Name: Prentice Docker [id number] Procedure:             Colonoscopy Indications:           Screening for colorectal malignant neoplasm Providers:             [name] MD, MD Medicines:             Monitored Anesthesia Care Complications:         No immediate complications. Procedure:             Pre-Anesthesia Assessment:                        - Prior to the procedure, a History and Physical was                         performed, and patient medications, allergies and                         sensitivities were reviewed. The patient's tolerance                         of previous anesthesia was reviewed.                        - The risks and benefits of the procedure and the                         sedation options and risks were discussed with the                         patient. All questions were answered and informed                         consent was obtained.                        - ASA Grade Assessment: II - A patient with mild                         systemic disease.                        After obtaining informed consent, the colonoscope was                         passed under direct vision. Throughout the procedure,                         the patient's blood pressure, pulse, and oxygen                         saturations were monitored continuously. The                         Colonoscope was introduced through the anus and  advanced to the the cecum, identified by the                         appendiceal orifice. The colonoscopy was performed                         with ease. The patient tolerated the procedure well.                         The quality of the bowel  preparation was good. Findings:      The perianal and digital rectal examinations were normal.      The entire examined colon appeared normal on direct and retroflexion       views. Impression:            - The entire examined colon is normal on direct and                         retroflexion views.                        - No specimens collected. Recommendation:        - Discharge patient to home (with escort).                        - Resume previous diet.                        - Continue present medications.                        - Repeat colonoscopy in 10 years for screening                         purposes. Procedure Code(s):     --- Professional ---                        [phone], Colonoscopy, flexible; diagnostic, including                         collection of specimen(s) by brushing or washing, when                         performed (separate procedure) Diagnosis Code(s):     --- Professional ---                        Z12.11, Encounter for screening for malignant neoplasm                         of colon CPT copyright 2019 [employer]. All rights reserved. The codes documented in this report are preliminary and upon coder review may  be revised to meet current compliance requirements. [name], MD [name] MD, MD 11/28/2021 8:58:23 AM This report has been signed electronically. Number of Addenda: 0 Note Initiated On: 11/28/2021 8:43 AM Scope Withdrawal Time: 0 hours 5 minutes 58 seconds  Total Procedure Duration: 0 hours 7 minutes 28 seconds  Estimated Blood Loss:  Estimated blood loss: none.      [facility]
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
## 2021-11-28 NOTE — Transfer of Care (Addendum)
Immediate Anesthesia Transfer of Care Note  Patient: Austin Berry  Procedure(s) Performed: COLONOSCOPY WITH PROPOFOL  Patient Location: PACU and Endoscopy Unit  Anesthesia Type:MAC  Level of Consciousness: drowsy  Airway & Oxygen Therapy: Patient Spontanous Breathing and Patient connected to face mask oxygen  Post-op Assessment: Report given to RN and Post -op Vital signs reviewed and stable  Post vital signs: Reviewed and stable  Last Vitals:  Vitals Value Taken Time  BP    Temp    Pulse    Resp    SpO2      Last Pain:  Vitals:   11/28/21 0800  TempSrc: Temporal  PainSc: 0-No pain         Complications: No notable events documented.

## 2021-11-28 NOTE — Anesthesia Postprocedure Evaluation (Signed)
Anesthesia Post Note  Patient: Austin Berry  Procedure(s) Performed: COLONOSCOPY WITH PROPOFOL  Patient location during evaluation: Endoscopy Anesthesia Type: General Level of consciousness: awake and alert Pain management: pain level controlled Vital Signs Assessment: post-procedure vital signs reviewed and stable Respiratory status: spontaneous breathing, nonlabored ventilation and respiratory function stable Cardiovascular status: blood pressure returned to baseline and stable Postop Assessment: no apparent nausea or vomiting Anesthetic complications: no   No notable events documented.   Last Vitals:  Vitals:   11/28/21 0910 11/28/21 0920  BP: (!) 179/108   Pulse:  90  Resp:    Temp:    SpO2:  100%    Last Pain:  Vitals:   11/28/21 0920  TempSrc:   PainSc: 0-No pain                 Foye Deer

## 2021-11-29 ENCOUNTER — Encounter: Payer: Self-pay | Admitting: Gastroenterology

## 2021-12-17 ENCOUNTER — Other Ambulatory Visit: Payer: Self-pay

## 2021-12-17 ENCOUNTER — Ambulatory Visit: Payer: Medicaid Other | Admitting: Gastroenterology

## 2021-12-27 ENCOUNTER — Ambulatory Visit: Payer: Medicaid Other | Admitting: Gastroenterology

## 2022-01-16 ENCOUNTER — Other Ambulatory Visit (HOSPITAL_COMMUNITY): Payer: Self-pay | Admitting: Gerontology

## 2022-01-16 ENCOUNTER — Ambulatory Visit (HOSPITAL_COMMUNITY)
Admission: RE | Admit: 2022-01-16 | Discharge: 2022-01-16 | Disposition: A | Discharge status: Home / Self Care | Payer: Medicaid Other | Source: Ambulatory Visit | Attending: Gerontology | Admitting: Gerontology

## 2022-01-16 DIAGNOSIS — R059 Cough, unspecified: Secondary | ICD-10-CM

## 2022-05-22 ENCOUNTER — Ambulatory Visit (INDEPENDENT_AMBULATORY_CARE_PROVIDER_SITE_OTHER): Payer: Medicaid Other | Admitting: Podiatry

## 2022-05-22 DIAGNOSIS — E119 Type 2 diabetes mellitus without complications: Secondary | ICD-10-CM

## 2022-05-22 DIAGNOSIS — B351 Tinea unguium: Secondary | ICD-10-CM | POA: Diagnosis not present

## 2022-05-22 DIAGNOSIS — E1142 Type 2 diabetes mellitus with diabetic polyneuropathy: Secondary | ICD-10-CM | POA: Diagnosis not present

## 2022-05-22 DIAGNOSIS — M79674 Pain in right toe(s): Secondary | ICD-10-CM

## 2022-05-22 DIAGNOSIS — M79675 Pain in left toe(s): Secondary | ICD-10-CM | POA: Diagnosis not present

## 2022-05-22 MED ORDER — GABAPENTIN 300 MG PO CAPS
300.0000 mg | ORAL_CAPSULE | Freq: Three times a day (TID) | ORAL | 0 refills | Status: AC
Start: 2022-05-22 — End: 2022-08-20

## 2022-05-23 ENCOUNTER — Telehealth: Payer: Self-pay

## 2022-05-23 NOTE — Progress Notes (Signed)
  Subjective:  Patient ID: Austin Berry, male    DOB: 09-Aug-1965,  MRN: [id number]  Chief Complaint  Patient presents with   Peripheral Neuropathy    Bilateral foot pain   Diabetes    Diabetic foot care   Nail Problem    Thick painful toenails     57 y.o. male presents with the above complaint. History confirmed with patient.  His nails are thickened elongated causing pain discomfort, he has burning tingling shooting pain constantly through his feet.  Says his diabetes is well-controlled  Objective:  Physical Exam: warm, good capillary refill, no trophic changes or ulcerative lesions, normal DP and PT pulses, and abnormal sensory exam. Left Foot: dystrophic yellowed discolored nail plates with subungual debris Right Foot: dystrophic yellowed discolored nail plates with subungual debris   Assessment:   1. Encounter for diabetic foot exam ([place])   2. Diabetic polyneuropathy associated with type 2 diabetes mellitus (HCC)   3. Pain due to onychomycosis of toenails of both feet      Plan:  Patient was evaluated and treated and all questions answered.   Patient educated on diabetes. Discussed proper diabetic foot care and discussed risks and complications of disease. Educated patient in depth on reasons to return to the office immediately should he/she discover anything concerning or new on the feet. All questions answered. Discussed proper shoes as well.   We discussed diabetic peripheral neuropathy as etiology and treatment options.  Has not had gabapentin he says.  I recommend he begin gabapentin 300 mg 3 times daily.  Discussed the possibility of uptitrating this if it is not improving  Discussed the etiology and treatment options for the condition in detail with the patient. Educated patient on the topical and oral treatment options for mycotic nails. Recommended debridement of the nails today. Sharp and mechanical debridement performed of all painful and mycotic nails today.  Nails debrided in length and thickness using a nail nipper to level of comfort. Discussed treatment options including appropriate shoe gear. Follow up as needed for painful nails.    Return if symptoms worsen or fail to improve.

## 2022-05-24 MED ORDER — DICLOFENAC SODIUM 1 % EX GEL: 4.0000 g | Freq: Four times a day (QID) | CUTANEOUS | 4 refills | Status: DC

## 2022-05-24 NOTE — Telephone Encounter (Signed)
Spoke to the patient and he said the cream was suppose to be an arthritis type cream for the back of his heel for pain

## 2022-05-24 NOTE — Telephone Encounter (Signed)
He said it would be cheaper if it was prescribed

## 2022-05-24 NOTE — Addendum Note (Signed)
Addended bySherryle Lis,  R on: 05/24/2022 01:30 PM   Modules accepted: Orders

## 2022-06-18 ENCOUNTER — Ambulatory Visit (INDEPENDENT_AMBULATORY_CARE_PROVIDER_SITE_OTHER): Payer: Medicaid Other | Admitting: Gastroenterology

## 2022-06-18 ENCOUNTER — Encounter: Payer: Self-pay | Admitting: Gastroenterology

## 2022-06-18 VITALS — BP 170/90 | HR 88 | Temp 98.8°F | Ht 72.0 in | Wt 190.0 lb

## 2022-06-18 DIAGNOSIS — R7989 Other specified abnormal findings of blood chemistry: Secondary | ICD-10-CM | POA: Diagnosis not present

## 2022-06-18 NOTE — Progress Notes (Signed)
Jonathon Bellows MD, MRCP(U.K) [address]  [place]  [place], [place] [address]  Main: [phone]  Fax: [phone]   Primary Care Physician: Carrolyn Meiers, MD  Primary Gastroenterologist:  Dr. Jonathon Bellows   Chief Complaint  Patient presents with   Vomiting    HPI: Austin Berry is a 57 y.o. male Summary of history :   He is a patient previously seen by Dr. Bonna Gains in November 2020.  History of reflux.  H. pylori was positive in August 2020 and underwent therapy with triple therapy.  History of constipation treated with high-fiber diet.  Colonoscopy in 2020 showed nonbleeding internal hemorrhoids poor prep was recommended repeat colonoscopy in 1 year.  Did not follow-up afterwards.  He tested subsequently negative H. pylori.   Interval history 08/13/2021-06/18/2022  At his last visit he was drinking alcohol on a daily basis for 15 years had been incarcerated many years back history of illegal drug use over 3 years back he had not tried a PPI for his regurgitation    11/28/2021 colonoscopy normal repeat in 10 years 09/26/2021 EGD: Normal  No recent labs since last visit.  Denies any regurgitation or reflux.  Still drinks alcohol on a daily basis.   Current Outpatient Medications  Medication Sig Dispense Refill   diclofenac Sodium (VOLTAREN) 1 % GEL Apply 4 g topically 4 (four) times daily. 100 g 4   acetaminophen (TYLENOL) 325 MG tablet Take 650 mg by mouth every 6 (six) hours as needed for mild pain or headache.     amLODipine-benazepril (LOTREL) 10-20 MG capsule Take 1 capsule by mouth daily.     atorvastatin (LIPITOR) 40 MG tablet Take 40 mg by mouth daily.     brimonidine (ALPHAGAN) 0.2 % ophthalmic solution Place 1 drop into both eyes 3 (three) times daily.     cetirizine (ZYRTEC) 10 MG tablet Take 10 mg by mouth daily.     famotidine (PEPCID) 20 MG tablet TAKE 1 TABLET BY MOUTH EVERY DAY (Patient taking differently: Take 20 mg by mouth 2 (two) times  daily.) 30 tablet 0   fluticasone (FLONASE) 50 MCG/ACT nasal spray Place 2 sprays into both nostrils daily.     gabapentin (NEURONTIN) 300 MG capsule Take 1 capsule (300 mg total) by mouth 3 (three) times daily. 270 capsule 0   hydrALAZINE (APRESOLINE) 25 MG tablet Take 1 tablet (25 mg total) by mouth every 8 (eight) hours. 90 tablet 0   metFORMIN (GLUCOPHAGE) 500 MG tablet Take 1 tablet (500 mg total) by mouth 2 (two) times daily with a meal. 60 tablet 0   metoprolol succinate (TOPROL-XL) 100 MG 24 hr tablet Take 100 mg by mouth daily.     naproxen (NAPROSYN) 500 MG tablet Take 500 mg by mouth 2 (two) times daily as needed for moderate pain.     omeprazole (PRILOSEC) 40 MG capsule Take 1 capsule (40 mg total) by mouth daily. 90 capsule 3   polyethylene glycol (GOLYTELY) 236 g solution 2 day Prep 8000 mL 0   PROAIR HFA 108 (90 Base) MCG/ACT inhaler Inhale 2 puffs into the lungs every 4 (four) hours as needed.     ROCKLATAN 0.02-0.005 % SOLN Place 1 drop into the right eye daily.     sertraline (ZOLOFT) 100 MG tablet Take 100 mg by mouth daily.     traZODone (DESYREL) 100 MG tablet Take 100 mg by mouth at bedtime.     No current facility-administered medications for  this visit.    Allergies as of 06/18/2022 - Review Complete 06/18/2022  Allergen Reaction Noted   Tramadol Itching 04/17/2015    ROS:  General: Negative for anorexia, weight loss, fever, chills, fatigue, weakness. ENT: Negative for hoarseness, difficulty swallowing , nasal congestion. CV: Negative for chest pain, angina, palpitations, dyspnea on exertion, peripheral edema.  Respiratory: Negative for dyspnea at rest, dyspnea on exertion, cough, sputum, wheezing.  GI: See history of present illness. GU:  Negative for dysuria, hematuria, urinary incontinence, urinary frequency, nocturnal urination.  Endo: Negative for unusual weight change.    Physical Examination:   BP (!) 162/79   Pulse 85   Temp 98.8 F (37.1 C) (Oral)    Ht 6' (1.829 m)   Wt 190 lb (86.2 kg)   BMI 25.77 kg/m   General: Well-nourished, well-developed in no acute distress.  Neuro: Alert and oriented x 3.  Grossly intact. Skin: Warm and dry, no jaundice.   Psych: Alert and cooperative, normal mood and affect.   Imaging Studies: No results found.  Assessment and Plan:   Austin Berry is a 57 y.o. y/o male with a history of GERD presently asymptomatic.  History of excess alcohol consumption history of high risk behavior previous LFTs mildly elevated no recent labs.     Plan 1.  Repeat LFTs if abnormal will require full autoimmune and viral hepatitis workup.  No recent labs for over a year.   Dr Jonathon Bellows  MD,MRCP [facility]) Follow up in 4 months

## 2022-06-19 LAB — HEPATIC FUNCTION PANEL
ALT: 65 IU/L — ABNORMAL HIGH (ref 0–44)
AST: 91 IU/L — ABNORMAL HIGH (ref 0–40)
Albumin: 4.5 g/dL (ref 3.8–4.9)
Alkaline Phosphatase: 150 IU/L — ABNORMAL HIGH (ref 44–121)
Bilirubin Total: 0.5 mg/dL (ref 0.0–1.2)
Bilirubin, Direct: 0.24 mg/dL (ref 0.00–0.40)
Total Protein: 7.4 g/dL (ref 6.0–8.5)

## 2022-06-27 ENCOUNTER — Telehealth: Payer: Self-pay

## 2022-06-27 DIAGNOSIS — R7989 Other specified abnormal findings of blood chemistry: Secondary | ICD-10-CM

## 2022-06-27 NOTE — Progress Notes (Signed)
Lft's still elevated, get full viral hepatitis and auto immune work up

## 2022-06-27 NOTE — Telephone Encounter (Signed)
Are these the labs you are wanting?

## 2022-06-27 NOTE — Telephone Encounter (Signed)
Called patient to let him know that Dr. Vicente Males is wanting additional labs and that he is able to go to any [facility] location in [place] since that's where he stated that he would like to go. I told him that the lab orders are in their system so he should not have any problems. I did let him know that if he was having any issues to please call us back. Patient had understood and had no further questions.

## 2022-06-27 NOTE — Telephone Encounter (Signed)
-----   Message from Jonathon Bellows, MD sent at 06/27/2022  2:44 PM EDT ----- Lft's still elevated, get full viral hepatitis and auto immune work up

## 2022-06-27 NOTE — Telephone Encounter (Signed)
Yes

## 2022-07-11 ENCOUNTER — Other Ambulatory Visit: Payer: Self-pay

## 2022-07-22 LAB — HEPATITIS A ANTIBODY, TOTAL: hep A Total Ab: POSITIVE — AB

## 2022-07-22 LAB — CK: Total CK: 115 U/L (ref 41–331)

## 2022-07-22 LAB — IRON,TIBC AND FERRITIN PANEL
Ferritin: 453 ng/mL — ABNORMAL HIGH (ref 30–400)
Iron Saturation: 31 % (ref 15–55)
Iron: 101 ug/dL (ref 38–169)
Total Iron Binding Capacity: 329 ug/dL (ref 250–450)
UIBC: 228 ug/dL (ref 111–343)

## 2022-07-22 LAB — IMMUNOGLOBULINS A/E/G/M, SERUM
IgA/Immunoglobulin A, Serum: 171 mg/dL (ref 90–386)
IgE (Immunoglobulin E), Serum: 33 IU/mL (ref 6–495)
IgG (Immunoglobin G), Serum: 1201 mg/dL (ref 603–1613)
IgM (Immunoglobulin M), Srm: 22 mg/dL (ref 20–172)

## 2022-07-22 LAB — CELIAC DISEASE AB SCREEN W/RFX
Antigliadin Abs, IgA: 5 units (ref 0–19)
Transglutaminase IgA: <2 U/mL (ref 0–3)

## 2022-07-22 LAB — CERULOPLASMIN: Ceruloplasmin: 24.1 mg/dL (ref 16.0–31.0)

## 2022-07-22 LAB — MITOCHONDRIAL/SMOOTH MUSCLE AB PNL
Mitochondrial Ab: <20 Units (ref 0.0–20.0)
Smooth Muscle Ab: 5 Units (ref 0–19)

## 2022-07-22 LAB — ALPHA-1-ANTITRYPSIN: A-1 Antitrypsin: 166 mg/dL (ref 101–187)

## 2022-07-22 LAB — HIV ANTIBODY (ROUTINE TESTING W REFLEX): HIV Screen 4th Generation wRfx: NONREACTIVE

## 2022-07-22 LAB — HEPATITIS B CORE ANTIBODY, TOTAL: Hep B Core Total Ab: NEGATIVE

## 2022-07-22 LAB — HEPATITIS B SURFACE ANTIGEN: Hepatitis B Surface Ag: NEGATIVE

## 2022-07-22 LAB — ANTI-MICROSOMAL ANTIBODY LIVER / KIDNEY: LKM1 Ab: 0.3 Units (ref 0.0–20.0)

## 2022-07-22 LAB — HEPATITIS C ANTIBODY: Hep C Virus Ab: NONREACTIVE

## 2022-07-22 LAB — ANA: Anti Nuclear Antibody (ANA): NEGATIVE

## 2022-07-22 LAB — HEPATITIS B E ANTIGEN: Hep B E Ag: NEGATIVE

## 2022-07-22 LAB — HEPATITIS B SURFACE ANTIBODY,QUALITATIVE: Hep B Surface Ab, Qual: REACTIVE

## 2022-07-22 LAB — HEPATITIS B E ANTIBODY: Hep B E Ab: NEGATIVE

## 2022-07-27 ENCOUNTER — Other Ambulatory Visit: Payer: Self-pay | Admitting: Gastroenterology

## 2022-08-02 ENCOUNTER — Ambulatory Visit (INDEPENDENT_AMBULATORY_CARE_PROVIDER_SITE_OTHER): Payer: Medicaid Other | Admitting: Podiatry

## 2022-08-02 DIAGNOSIS — M7751 Other enthesopathy of right foot: Secondary | ICD-10-CM | POA: Diagnosis not present

## 2022-08-02 DIAGNOSIS — M79675 Pain in left toe(s): Secondary | ICD-10-CM | POA: Diagnosis not present

## 2022-08-02 DIAGNOSIS — M7752 Other enthesopathy of left foot: Secondary | ICD-10-CM | POA: Diagnosis not present

## 2022-08-02 DIAGNOSIS — M79674 Pain in right toe(s): Secondary | ICD-10-CM

## 2022-08-02 DIAGNOSIS — B351 Tinea unguium: Secondary | ICD-10-CM | POA: Diagnosis not present

## 2022-08-02 DIAGNOSIS — E119 Type 2 diabetes mellitus without complications: Secondary | ICD-10-CM
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
## 2022-08-02 NOTE — Progress Notes (Signed)
Needs RUQ USG for abnormal lft's

## 2022-08-05 MED ORDER — TRIAMCINOLONE ACETONIDE 40 MG/ML IJ SUSP: 20.0000 mg | Freq: Once | INTRAMUSCULAR | Status: DC

## 2022-08-08 ENCOUNTER — Telehealth: Payer: Self-pay

## 2022-08-08 DIAGNOSIS — R7989 Other specified abnormal findings of blood chemistry: Secondary | ICD-10-CM

## 2022-08-08 NOTE — Telephone Encounter (Signed)
Called patient but he did not answer. I left him a voicemail letting him know that Dr. Tobi Bastos wanted him to have an ultrasound done since he had elevated LFTs. Appointment reminder was mailed for patient.

## 2022-08-08 NOTE — Telephone Encounter (Signed)
-----   Message from [name], MD sent at 08/02/2022  7:49 AM EDT ----- Needs RUQ USG for abnormal lft's

## 2022-08-27 ENCOUNTER — Ambulatory Visit (HOSPITAL_COMMUNITY): Payer: Medicaid Other

## 2022-09-13 ENCOUNTER — Ambulatory Visit (HOSPITAL_COMMUNITY)
Admission: RE | Admit: 2022-09-13 | Discharge: 2022-09-13 | Disposition: A | Discharge status: Home / Self Care | Payer: Medicaid Other | Source: Ambulatory Visit | Attending: Gastroenterology | Admitting: Gastroenterology

## 2022-09-13 DIAGNOSIS — R7989 Other specified abnormal findings of blood chemistry: Secondary | ICD-10-CM | POA: Insufficient documentation

## 2022-09-13 NOTE — Progress Notes (Signed)
Elevated LFT's suggest MRCP please proceed

## 2022-09-16 ENCOUNTER — Telehealth: Payer: Self-pay

## 2022-09-16 DIAGNOSIS — R7989 Other specified abnormal findings of blood chemistry: Secondary | ICD-10-CM

## 2022-09-16 NOTE — Telephone Encounter (Signed)
-----   Message from [name], MD sent at 09/13/2022 11:06 AM EDT ----- Elevated LFT's suggest MRCP please proceed

## 2022-09-16 NOTE — Telephone Encounter (Signed)
Called patient and spoke to his caregiver-Jaculine and let her know when is patient's appointment for his MRCP. She stated that she will take the patient. Jaculine was also notified to get there 15 minutes before and that the patient is not to eat or drink, 4 hours prior. Jaculine had no further questions. I will mail him a copy of the appointment.

## 2022-10-10 ENCOUNTER — Ambulatory Visit (HOSPITAL_COMMUNITY)
Admission: RE | Admit: 2022-10-10 | Discharge: 2022-10-10 | Disposition: A | Discharge status: Home / Self Care | Payer: Medicaid Other | Source: Ambulatory Visit | Attending: Gastroenterology | Admitting: Gastroenterology

## 2022-10-10 ENCOUNTER — Other Ambulatory Visit: Payer: Self-pay | Admitting: Gastroenterology

## 2022-10-10 DIAGNOSIS — R7989 Other specified abnormal findings of blood chemistry: Secondary | ICD-10-CM | POA: Insufficient documentation

## 2022-10-10 MED ORDER — GADOBUTROL 1 MMOL/ML IV SOLN
10.0000 mL | Freq: Once | INTRAVENOUS | Status: AC | PRN
  Administered 2022-10-10: 10 mL via INTRAVENOUS

## 2022-10-16 NOTE — Progress Notes (Signed)
Shows lot of fat in liver- needs to stop drinking alcohol and follow up with [name]

## 2022-10-17 ENCOUNTER — Telehealth: Payer: Self-pay

## 2022-10-17 NOTE — Telephone Encounter (Signed)
-----   Message from [name] sent at 10/16/2022 10:50 AM EDT ----- Shows lot of fat in liver- needs to stop drinking alcohol and follow up with [name]

## 2022-10-17 NOTE — Telephone Encounter (Signed)
Called patient but spoke to his caregiver Austin Berry and let her know that the patient is needed to stop drinking alcohol and that he is needing to follow up with Austin Berry. Patient's follow up was scheduled and a reminder will be sent by mail.

## 2022-10-28 ENCOUNTER — Other Ambulatory Visit: Payer: Self-pay

## 2022-10-29 ENCOUNTER — Encounter (HOSPITAL_COMMUNITY): Payer: Self-pay | Admitting: Emergency Medicine

## 2022-10-29 ENCOUNTER — Other Ambulatory Visit: Payer: Self-pay

## 2022-10-29 ENCOUNTER — Emergency Department (HOSPITAL_COMMUNITY): Payer: Medicaid Other

## 2022-10-29 ENCOUNTER — Inpatient Hospital Stay (HOSPITAL_COMMUNITY)
Admission: EM | Admit: 2022-10-29 | Discharge: 2022-12-01 | DRG: 915 | Disposition: E | Payer: Medicaid Other | Attending: Internal Medicine | Admitting: Internal Medicine

## 2022-10-29 DIAGNOSIS — Z7984 Long term (current) use of oral hypoglycemic drugs: Secondary | ICD-10-CM

## 2022-10-29 DIAGNOSIS — J189 Pneumonia, unspecified organism: Secondary | ICD-10-CM | POA: Diagnosis not present

## 2022-10-29 DIAGNOSIS — Z970 Presence of artificial eye: Secondary | ICD-10-CM

## 2022-10-29 DIAGNOSIS — I213 ST elevation (STEMI) myocardial infarction of unspecified site: Secondary | ICD-10-CM | POA: Diagnosis not present

## 2022-10-29 DIAGNOSIS — Z781 Physical restraint status: Secondary | ICD-10-CM

## 2022-10-29 DIAGNOSIS — T41295A Adverse effect of other general anesthetics, initial encounter: Secondary | ICD-10-CM | POA: Diagnosis not present

## 2022-10-29 DIAGNOSIS — E1122 Type 2 diabetes mellitus with diabetic chronic kidney disease: Secondary | ICD-10-CM | POA: Diagnosis present

## 2022-10-29 DIAGNOSIS — R6520 Severe sepsis without septic shock: Secondary | ICD-10-CM | POA: Diagnosis not present

## 2022-10-29 DIAGNOSIS — E876 Hypokalemia: Secondary | ICD-10-CM | POA: Diagnosis not present

## 2022-10-29 DIAGNOSIS — F10139 Alcohol abuse with withdrawal, unspecified: Secondary | ICD-10-CM | POA: Diagnosis not present

## 2022-10-29 DIAGNOSIS — Y95 Nosocomial condition: Secondary | ICD-10-CM | POA: Diagnosis not present

## 2022-10-29 DIAGNOSIS — K148 Other diseases of tongue: Secondary | ICD-10-CM | POA: Diagnosis not present

## 2022-10-29 DIAGNOSIS — Z9911 Dependence on respirator [ventilator] status: Secondary | ICD-10-CM | POA: Diagnosis not present

## 2022-10-29 DIAGNOSIS — D6489 Other specified anemias: Secondary | ICD-10-CM | POA: Diagnosis not present

## 2022-10-29 DIAGNOSIS — K760 Fatty (change of) liver, not elsewhere classified: Secondary | ICD-10-CM | POA: Diagnosis present

## 2022-10-29 DIAGNOSIS — J80 Acute respiratory distress syndrome: Secondary | ICD-10-CM | POA: Diagnosis present

## 2022-10-29 DIAGNOSIS — Z885 Allergy status to narcotic agent status: Secondary | ICD-10-CM

## 2022-10-29 DIAGNOSIS — E872 Acidosis, unspecified: Secondary | ICD-10-CM | POA: Diagnosis present

## 2022-10-29 DIAGNOSIS — Z888 Allergy status to other drugs, medicaments and biological substances status: Secondary | ICD-10-CM

## 2022-10-29 DIAGNOSIS — J9601 Acute respiratory failure with hypoxia: Secondary | ICD-10-CM

## 2022-10-29 DIAGNOSIS — I129 Hypertensive chronic kidney disease with stage 1 through stage 4 chronic kidney disease, or unspecified chronic kidney disease: Secondary | ICD-10-CM | POA: Diagnosis present

## 2022-10-29 DIAGNOSIS — T464X5A Adverse effect of angiotensin-converting-enzyme inhibitors, initial encounter: Secondary | ICD-10-CM | POA: Diagnosis present

## 2022-10-29 DIAGNOSIS — R7881 Bacteremia: Secondary | ICD-10-CM | POA: Diagnosis present

## 2022-10-29 DIAGNOSIS — E781 Pure hyperglyceridemia: Secondary | ICD-10-CM | POA: Diagnosis not present

## 2022-10-29 DIAGNOSIS — T783XXA Angioneurotic edema, initial encounter: Secondary | ICD-10-CM | POA: Diagnosis not present

## 2022-10-29 DIAGNOSIS — N1832 Chronic kidney disease, stage 3b: Secondary | ICD-10-CM | POA: Diagnosis present

## 2022-10-29 DIAGNOSIS — G9341 Metabolic encephalopathy: Secondary | ICD-10-CM | POA: Diagnosis not present

## 2022-10-29 DIAGNOSIS — T17890A Other foreign object in other parts of respiratory tract causing asphyxiation, initial encounter: Secondary | ICD-10-CM | POA: Diagnosis not present

## 2022-10-29 DIAGNOSIS — Z515 Encounter for palliative care: Secondary | ICD-10-CM

## 2022-10-29 DIAGNOSIS — N170 Acute kidney failure with tubular necrosis: Secondary | ICD-10-CM | POA: Diagnosis present

## 2022-10-29 DIAGNOSIS — K567 Ileus, unspecified: Secondary | ICD-10-CM | POA: Diagnosis not present

## 2022-10-29 DIAGNOSIS — J151 Pneumonia due to Pseudomonas: Secondary | ICD-10-CM | POA: Diagnosis not present

## 2022-10-29 DIAGNOSIS — F05 Delirium due to known physiological condition: Secondary | ICD-10-CM | POA: Diagnosis not present

## 2022-10-29 DIAGNOSIS — H548 Legal blindness, as defined in USA: Secondary | ICD-10-CM | POA: Diagnosis present

## 2022-10-29 DIAGNOSIS — A4152 Sepsis due to Pseudomonas: Secondary | ICD-10-CM | POA: Diagnosis not present

## 2022-10-29 DIAGNOSIS — E8770 Fluid overload, unspecified: Secondary | ICD-10-CM | POA: Diagnosis not present

## 2022-10-29 DIAGNOSIS — K7030 Alcoholic cirrhosis of liver without ascites: Secondary | ICD-10-CM | POA: Diagnosis present

## 2022-10-29 DIAGNOSIS — N179 Acute kidney failure, unspecified: Secondary | ICD-10-CM | POA: Diagnosis not present

## 2022-10-29 DIAGNOSIS — F17210 Nicotine dependence, cigarettes, uncomplicated: Secondary | ICD-10-CM | POA: Diagnosis present

## 2022-10-29 DIAGNOSIS — E875 Hyperkalemia: Secondary | ICD-10-CM | POA: Diagnosis not present

## 2022-10-29 DIAGNOSIS — R001 Bradycardia, unspecified: Secondary | ICD-10-CM | POA: Diagnosis not present

## 2022-10-29 DIAGNOSIS — J690 Pneumonitis due to inhalation of food and vomit: Secondary | ICD-10-CM | POA: Diagnosis present

## 2022-10-29 DIAGNOSIS — E1165 Type 2 diabetes mellitus with hyperglycemia: Secondary | ICD-10-CM | POA: Diagnosis present

## 2022-10-29 DIAGNOSIS — K219 Gastro-esophageal reflux disease without esophagitis: Secondary | ICD-10-CM | POA: Diagnosis present

## 2022-10-29 DIAGNOSIS — Z66 Do not resuscitate: Secondary | ICD-10-CM | POA: Diagnosis not present

## 2022-10-29 DIAGNOSIS — R34 Anuria and oliguria: Secondary | ICD-10-CM | POA: Diagnosis not present

## 2022-10-29 DIAGNOSIS — U071 COVID-19: Secondary | ICD-10-CM | POA: Diagnosis not present

## 2022-10-29 DIAGNOSIS — T783XXD Angioneurotic edema, subsequent encounter: Secondary | ICD-10-CM | POA: Diagnosis not present

## 2022-10-29 DIAGNOSIS — B9689 Other specified bacterial agents as the cause of diseases classified elsewhere: Secondary | ICD-10-CM | POA: Diagnosis present

## 2022-10-29 DIAGNOSIS — T447X5A Adverse effect of beta-adrenoreceptor antagonists, initial encounter: Secondary | ICD-10-CM | POA: Diagnosis not present

## 2022-10-29 DIAGNOSIS — K089 Disorder of teeth and supporting structures, unspecified: Secondary | ICD-10-CM | POA: Diagnosis present

## 2022-10-29 DIAGNOSIS — Z79899 Other long term (current) drug therapy: Secondary | ICD-10-CM

## 2022-10-29 HISTORY — DX: Alcohol abuse, uncomplicated: F10.10

## 2022-10-29 HISTORY — DX: Fatty (change of) liver, not elsewhere classified: K76.0

## 2022-10-29 LAB — CBC WITH DIFFERENTIAL/PLATELET
Abs Immature Granulocytes: 0.19 10*3/uL — ABNORMAL HIGH (ref 0.00–0.07)
Basophils Absolute: 0.1 10*3/uL (ref 0.0–0.1)
Basophils Relative: 1 %
Eosinophils Absolute: 0.5 10*3/uL (ref 0.0–0.5)
Eosinophils Relative: 5 %
HCT: 31.8 % — ABNORMAL LOW (ref 39.0–52.0)
Hemoglobin: 10.4 g/dL — ABNORMAL LOW (ref 13.0–17.0)
Immature Granulocytes: 2 %
Lymphocytes Relative: 23 %
Lymphs Abs: 2.4 10*3/uL (ref 0.7–4.0)
MCH: 34.2 pg — ABNORMAL HIGH (ref 26.0–34.0)
MCHC: 32.7 g/dL (ref 30.0–36.0)
MCV: 104.6 fL — ABNORMAL HIGH (ref 80.0–100.0)
Monocytes Absolute: 1.7 10*3/uL — ABNORMAL HIGH (ref 0.1–1.0)
Monocytes Relative: 16 %
Neutro Abs: 5.6 10*3/uL (ref 1.7–7.7)
Neutrophils Relative %: 53 %
Platelets: 312 10*3/uL (ref 150–400)
RBC: 3.04 MIL/uL — ABNORMAL LOW (ref 4.22–5.81)
RDW: 14.1 % (ref 11.5–15.5)
WBC: 10.5 10*3/uL (ref 4.0–10.5)
nRBC: 0 % (ref 0.0–0.2)

## 2022-10-29 LAB — BASIC METABOLIC PANEL
Anion gap: 12 (ref 5–15)
BUN: 19 mg/dL (ref 6–20)
CO2: 18 mmol/L — ABNORMAL LOW (ref 22–32)
Calcium: 8.7 mg/dL — ABNORMAL LOW (ref 8.9–10.3)
Chloride: 109 mmol/L (ref 98–111)
Creatinine, Ser: 2.39 mg/dL — ABNORMAL HIGH (ref 0.61–1.24)
GFR, Estimated: 31 mL/min — ABNORMAL LOW (ref >60)
Glucose, Bld: 136 mg/dL — ABNORMAL HIGH (ref 70–99)
Potassium: 3.5 mmol/L (ref 3.5–5.1)
Sodium: 139 mmol/L (ref 135–145)

## 2022-10-29 LAB — BLOOD GAS, ARTERIAL
Acid-base deficit: 8.2 mmol/L — ABNORMAL HIGH (ref 0.0–2.0)
Bicarbonate: 18.4 mmol/L — ABNORMAL LOW (ref 20.0–28.0)
Drawn by: 22223
O2 Saturation: 99.6 %
Patient temperature: 37
pCO2 arterial: 41 mmHg (ref 32–48)
pH, Arterial: 7.26 — ABNORMAL LOW (ref 7.35–7.45)
pO2, Arterial: 135 mmHg — ABNORMAL HIGH (ref 83–108)

## 2022-10-29 MED ORDER — DOCUSATE SODIUM 50 MG/5ML PO LIQD
100.0000 mg | Freq: Two times a day (BID) | ORAL | Status: DC
  Administered 2022-10-30 – 2022-11-03 (×10): 100 mg
  Filled 2022-10-29 (×10): qty 10

## 2022-10-29 MED ORDER — POLYETHYLENE GLYCOL 3350 17 G PO PACK
17.0000 g | PACK | Freq: Every day | ORAL | Status: DC
  Administered 2022-10-30 – 2022-11-01 (×3): 17 g
  Filled 2022-10-29 (×3): qty 1

## 2022-10-29 MED ORDER — PROPOFOL 1000 MG/100ML IV EMUL
5.0000 ug/kg/min | INTRAVENOUS | Status: DC
  Administered 2022-10-29: 20 ug/kg/min via INTRAVENOUS
  Administered 2022-10-30: 60 ug/kg/min via INTRAVENOUS
  Administered 2022-10-30: 50 ug/kg/min via INTRAVENOUS
  Administered 2022-10-30 (×2): 60 ug/kg/min via INTRAVENOUS
  Administered 2022-10-30: 50 ug/kg/min via INTRAVENOUS
  Administered 2022-10-30: 65 ug/kg/min via INTRAVENOUS
  Administered 2022-10-30 – 2022-10-31 (×2): 70 ug/kg/min via INTRAVENOUS
  Administered 2022-10-31: 60 ug/kg/min via INTRAVENOUS
  Administered 2022-10-31 (×3): 70 ug/kg/min via INTRAVENOUS
  Administered 2022-10-31: 60 ug/kg/min via INTRAVENOUS
  Administered 2022-10-31: 70 ug/kg/min via INTRAVENOUS
  Administered 2022-10-31 – 2022-11-01 (×5): 80 ug/kg/min via INTRAVENOUS
  Filled 2022-10-29 (×5): qty 100
  Filled 2022-10-29: qty 200
  Filled 2022-10-29 (×13): qty 100

## 2022-10-29 MED ORDER — KETAMINE HCL 10 MG/ML IJ SOLN
INTRAMUSCULAR | Status: AC | PRN
  Administered 2022-10-29: 100 mg via INTRAVENOUS

## 2022-10-29 MED ORDER — FENTANYL 2500MCG IN NS 250ML (10MCG/ML) PREMIX INFUSION
INTRAVENOUS | Status: AC
  Administered 2022-10-29: 25 ug/h via INTRAVENOUS
  Filled 2022-10-29: qty 250

## 2022-10-29 MED ORDER — KETAMINE HCL 50 MG/5ML IJ SOSY
PREFILLED_SYRINGE | INTRAMUSCULAR | Status: AC
  Administered 2022-10-29: 50 mg via INTRAVENOUS
  Filled 2022-10-29: qty 5

## 2022-10-29 MED ORDER — FOLIC ACID 1 MG PO TABS
1.0000 mg | ORAL_TABLET | Freq: Every day | ORAL | Status: DC
  Administered 2022-10-30 – 2022-11-13 (×15): 1 mg
  Filled 2022-10-29 (×16): qty 1

## 2022-10-29 MED ORDER — SODIUM CHLORIDE 0.9 % IV BOLUS: 500.0000 mL | Freq: Once | INTRAVENOUS | Status: DC

## 2022-10-29 MED ORDER — KETAMINE HCL 10 MG/ML IJ SOLN
INTRAMUSCULAR | Status: AC
  Filled 2022-10-29: qty 1

## 2022-10-29 MED ORDER — THIAMINE HCL 100 MG/ML IJ SOLN
100.0000 mg | Freq: Every day | INTRAMUSCULAR | Status: DC
  Filled 2022-10-29 (×3): qty 2

## 2022-10-29 MED ORDER — KETAMINE HCL 50 MG/5ML IJ SOSY
PREFILLED_SYRINGE | INTRAMUSCULAR | Status: AC
  Administered 2022-10-29: 100 mg via INTRAVENOUS
  Filled 2022-10-29: qty 10

## 2022-10-29 MED ORDER — THIAMINE MONONITRATE 100 MG PO TABS
100.0000 mg | ORAL_TABLET | Freq: Every day | ORAL | Status: DC
  Administered 2022-10-30 – 2022-11-13 (×15): 100 mg
  Filled 2022-10-29 (×15): qty 1

## 2022-10-29 MED ORDER — GLYCOPYRROLATE 0.2 MG/ML IJ SOLN
0.1000 mg | Freq: Once | INTRAMUSCULAR | Status: AC
  Administered 2022-10-29 (×2): 0.1 mg via INTRAVENOUS
  Filled 2022-10-29: qty 1

## 2022-10-29 MED ORDER — DIPHENHYDRAMINE HCL 50 MG/ML IJ SOLN
25.0000 mg | Freq: Once | INTRAMUSCULAR | Status: AC
  Administered 2022-10-29: 25 mg via INTRAVENOUS
  Filled 2022-10-29: qty 1

## 2022-10-29 MED ORDER — KETAMINE HCL 50 MG/5ML IJ SOSY
PREFILLED_SYRINGE | INTRAMUSCULAR | Status: AC
  Filled 2022-10-29: qty 5

## 2022-10-29 MED ORDER — PROPOFOL 1000 MG/100ML IV EMUL
INTRAVENOUS | Status: AC
  Filled 2022-10-29: qty 100

## 2022-10-29 MED ORDER — FAMOTIDINE IN NACL 20-0.9 MG/50ML-% IV SOLN
20.0000 mg | Freq: Once | INTRAVENOUS | Status: AC
  Administered 2022-10-29: 20 mg via INTRAVENOUS
  Filled 2022-10-29: qty 50

## 2022-10-29 MED ORDER — SODIUM CHLORIDE 0.9 % IV BOLUS (SEPSIS)
500.0000 mL | Freq: Once | INTRAVENOUS | Status: AC
  Administered 2022-10-29: 500 mL via INTRAVENOUS

## 2022-10-29 MED ORDER — KETAMINE HCL 50 MG/5ML IJ SOSY
1.0000 mg/kg | PREFILLED_SYRINGE | INTRAMUSCULAR | Status: AC
  Administered 2022-10-29: 50 mg via INTRAVENOUS

## 2022-10-29 MED ORDER — ROCURONIUM BROMIDE 50 MG/5ML IV SOLN
100.0000 mg | INTRAVENOUS | Status: AC
  Filled 2022-10-29: qty 10

## 2022-10-29 MED ORDER — SODIUM CHLORIDE 0.9 % IV SOLN
500.0000 mg | INTRAVENOUS | Status: AC
  Administered 2022-10-30: 500 mg via INTRAVENOUS
  Filled 2022-10-29: qty 5

## 2022-10-29 MED ORDER — MIDAZOLAM HCL 2 MG/2ML IJ SOLN
1.0000 mg | INTRAMUSCULAR | Status: DC | PRN
  Administered 2022-10-30: 1 mg via INTRAVENOUS
  Administered 2022-10-31 – 2022-11-02 (×7): 2 mg via INTRAVENOUS
  Filled 2022-10-29 (×9): qty 2

## 2022-10-29 MED ORDER — GLYCOPYRROLATE 0.2 MG/ML IJ SOLN
INTRAMUSCULAR | Status: AC
  Filled 2022-10-29: qty 1

## 2022-10-29 MED ORDER — OXYMETAZOLINE HCL 0.05 % NA SOLN
1.0000 | Freq: Once | NASAL | Status: AC
  Administered 2022-10-29 (×2): 1 via NASAL
  Filled 2022-10-29: qty 30

## 2022-10-29 MED ORDER — ONDANSETRON HCL 4 MG/2ML IJ SOLN
4.0000 mg | Freq: Once | INTRAMUSCULAR | Status: AC
  Administered 2022-10-29: 4 mg via INTRAVENOUS
  Filled 2022-10-29: qty 2

## 2022-10-29 MED ORDER — SODIUM CHLORIDE 0.9 % IV SOLN
1000.0000 mL | INTRAVENOUS | Status: DC
  Administered 2022-10-29 – 2022-10-30 (×3): 1000 mL via INTRAVENOUS

## 2022-10-29 MED ORDER — ETOMIDATE 2 MG/ML IV SOLN
INTRAVENOUS | Status: AC
  Filled 2022-10-29: qty 20

## 2022-10-29 MED ORDER — FENTANYL 2500MCG IN NS 250ML (10MCG/ML) PREMIX INFUSION
0.0000 ug/h | INTRAVENOUS | Status: DC
  Administered 2022-10-30: 275 ug/h via INTRAVENOUS
  Administered 2022-10-30: 250 ug/h via INTRAVENOUS
  Administered 2022-10-31: 275 ug/h via INTRAVENOUS
  Administered 2022-10-31: 300 ug/h via INTRAVENOUS
  Administered 2022-10-31: 275 ug/h via INTRAVENOUS
  Administered 2022-11-01: 350 ug/h via INTRAVENOUS
  Filled 2022-10-29 (×7): qty 250

## 2022-10-29 MED ORDER — SODIUM CHLORIDE 0.9 % IV SOLN
2.0000 g | Freq: Once | INTRAVENOUS | Status: AC
  Administered 2022-10-29: 2 g via INTRAVENOUS
  Filled 2022-10-29: qty 20

## 2022-10-29 MED ORDER — METHYLPREDNISOLONE SODIUM SUCC 125 MG IJ SOLR
125.0000 mg | Freq: Once | INTRAMUSCULAR | Status: AC
  Administered 2022-10-29: 125 mg via INTRAVENOUS
  Filled 2022-10-29: qty 2

## 2022-10-29 MED ORDER — ROCURONIUM BROMIDE 10 MG/ML (PF) SYRINGE
PREFILLED_SYRINGE | INTRAVENOUS | Status: AC
  Administered 2022-10-29: 100 mg via INTRAVENOUS
  Filled 2022-10-29: qty 10

## 2022-10-29 NOTE — Progress Notes (Signed)
TRansfer request call from [facility] ER doc 10/29/2022 10:12 PM   [name] -Has hypertension on benazepril.  He was at dinner.  He ate hibachi shrimp and chicken and during the dinner stung started swelling up.  He present to the ER.  This was via EMS EMS given some epinephrine.  But at no point he had hives or diarrhea or tachypnea.  He in the ER deteriorated with a large to swollen tongue.  He also started having stridor.  Nasal intubation did not work because of secretions.  The EDP intubated orally with ketamine.  ET tube size 7.  He is now sedated with propofol and fentanyl hemodynamically stable slightly tachycardic.  Has been given Pepcid Solu-Medrol and is stable on the ventilator according to the EDP  Plan - Accepted in transfer to [facility] in [place]    Dr. Kalman Shan, M.D., F.C.C.P,  Pulmonary and Critical Care Medicine Staff Physician, [facility] Center Director - Interstitial Lung Disease  Program  Pulmonary Fibrosis [facility] Network at [facility] [place], [place], [address]   Pager: [phone], If no answer  -> Check AMION or Try [phone] Telephone (clinical office): [phone] Telephone (research): [phone]  10:14 PM 10/29/2022

## 2022-10-29 NOTE — ED Notes (Signed)
Pt was BIB EMS d/t concerns for allergic reaction after eating hibachi for dinner. Pt presents with airway swelling, difficulty speaking, and difficulty passing secretions. Pt is alert and following commands. 94% NRB. Given IM epi in route.

## 2022-10-29 NOTE — ED Notes (Signed)
Unsuccessful nasal intubation.

## 2022-10-29 NOTE — ED Provider Notes (Signed)
[place] EMERGENCY DEPARTMENT AT [facility] Provider Note   CSN: [id number] Arrival date & time: 10/29/22  2122     History {Add pertinent medical, surgical, social history, OB history to HPI:1} Chief Complaint  Patient presents with   Allergic Reaction    Austin Berry is a 56 y.o. male.  57 year old male with a history of hypertension on benazepril, diabetes, and blindness who presents to the emergency department with tongue swelling.  20 minutes prior to arrival patient was given hibachi with shrimp and chicken.  Has had this meal multiple times before without any allergic reactions.  Then started to experience tongue swelling and was having difficulty speaking and swallowing.  No tooth infections recently or fevers.  His friend called 911.  No rash.  No nausea or vomiting or diarrhea.  No similar occurrences of tongue swelling or lip swelling.  No family members that he knows of that has had similar symptoms but he is adopted.  EMS gave him 0.3 mg of IM epinephrine but reports that his tongue is still swelling.       Home Medications Prior to Admission medications   Medication Sig Start Date End Date Taking? Authorizing Provider  acetaminophen (TYLENOL) 325 MG tablet Take 650 mg by mouth every 6 (six) hours as needed for mild pain or headache.    [provider]  amLODipine-benazepril (LOTREL) 10-20 MG capsule Take 1 capsule by mouth daily. 11/11/21   [provider]  atorvastatin (LIPITOR) 40 MG tablet Take 40 mg by mouth daily.    [provider]  brimonidine (ALPHAGAN) 0.2 % ophthalmic solution Place 1 drop into both eyes 3 (three) times daily.    [provider]  cetirizine (ZYRTEC) 10 MG tablet Take 10 mg by mouth daily. 06/28/20   [provider]  diclofenac Sodium (VOLTAREN) 1 % GEL Apply 4 g topically 4 (four) times daily. 05/24/22   Edwin Cap, DPM  dorzolamide-timolol (COSOPT) 2-0.5 % ophthalmic solution  SMARTSIG:In Eye(s) 07/02/22   [provider]  famotidine (PEPCID) 20 MG tablet TAKE 1 TABLET BY MOUTH EVERY DAY Patient taking differently: Take 20 mg by mouth 2 (two) times daily. 10/01/19   Pasty Spillers, MD  fluticasone (FLONASE) 50 MCG/ACT nasal spray Place 2 sprays into both nostrils daily. 05/29/20   [provider]  gabapentin (NEURONTIN) 300 MG capsule Take 1 capsule (300 mg total) by mouth 3 (three) times daily. 05/22/22 08/20/22  McDonald, Rachelle Hora, DPM  hydrALAZINE (APRESOLINE) 25 MG tablet Take 1 tablet (25 mg total) by mouth every 8 (eight) hours. 08/18/21 09/17/21  Sherryll Burger, Pratik D, DO  metFORMIN (GLUCOPHAGE) 500 MG tablet Take 1 tablet (500 mg total) by mouth 2 (two) times daily with a meal. 04/04/15   Sharman Cheek, MD  metoprolol succinate (TOPROL-XL) 100 MG 24 hr tablet Take 100 mg by mouth daily. 11/11/21   [provider]  naproxen (NAPROSYN) 500 MG tablet Take 500 mg by mouth 2 (two) times daily as needed for moderate pain. 09/10/18   [provider]  omeprazole (PRILOSEC) 40 MG capsule Take 1 capsule (40 mg total) by mouth daily. 08/13/21   [name], MD  polyethylene glycol (GOLYTELY) 236 g solution 2 day Prep 10/23/21   [name], MD  [facility] HFA 108 [phone] Base) MCG/ACT inhaler Inhale 2 puffs into the lungs every 4 (four) hours as needed. 07/19/20   [provider]  ROCKLATAN 0.02-0.005 % SOLN Place 1 drop into the  right eye daily. 07/19/20   [provider]  sertraline (ZOLOFT) 100 MG tablet Take 100 mg by mouth daily. 07/14/20   [provider]  traZODone (DESYREL) 100 MG tablet Take 100 mg by mouth at bedtime. 05/08/20   [provider]  Omeprazole (PRILOSEC PO) Take by mouth.  06/07/21  [provider]      Allergies    Tramadol    Review of Systems   Review of Systems  Physical Exam Updated Vital Signs Ht 6' (1.829 m)   Wt 86.2 kg   BMI 25.77 kg/m  Physical Exam Vitals and nursing note  reviewed.  Constitutional:      General: He is in acute distress.     Appearance: He is well-developed. He is ill-appearing.     Comments: On nonrebreather.  Stridorous.  Having difficulty tolerating his secretions.  HENT:     Head: Normocephalic and atraumatic.     Right Ear: External ear normal.     Left Ear: External ear normal.     Nose: Nose normal.     Mouth/Throat:     Comments:   Large swollen tongue  No swelling of the lips. Eyes:     Comments: Blind.  Disconjugate gaze.  Opacification of cornea bilaterally.  Cardiovascular:     Rate and Rhythm: Regular rhythm. Tachycardia present.     Heart sounds: Normal heart sounds.  Pulmonary:     Effort: Respiratory distress present.     Breath sounds: Stridor present.  Musculoskeletal:     Cervical back: Normal range of motion and neck supple.     Right lower leg: No edema.     Left lower leg: No edema.  Skin:    General: Skin is warm and dry.     Findings: No rash.  Neurological:     Mental Status: He is alert. Mental status is at baseline.  Psychiatric:        Mood and Affect: Mood normal.        Behavior: Behavior normal.     ED Results / Procedures / Treatments   Labs (all labs ordered are listed, but only abnormal results are displayed) Labs Reviewed  BASIC METABOLIC PANEL  CBC WITH DIFFERENTIAL/PLATELET    EKG None  Radiology No results found.  Procedures Procedures  {Document cardiac monitor, telemetry assessment procedure when appropriate:1}  Medications Ordered in ED Medications  diphenhydrAMINE (BENADRYL) injection 25 mg (has no administration in time range)  methylPREDNISolone sodium succinate (SOLU-MEDROL) 125 mg/2 mL injection 125 mg (has no administration in time range)  famotidine (PEPCID) IVPB 20 mg premix (has no administration in time range)    ED Course/ Medical Decision Making/ A&P Clinical Course as of 10/29/22 2235  Tue Oct 29, 2022  [address] [RP]    Clinical Course User  Index [RP] Rondel Baton, MD   {   Click here for ABCD2, HEART and other calculatorsREFRESH Note before signing :1}                              Medical Decision Making Amount and/or Complexity of Data Reviewed Labs: ordered. Radiology: ordered.  Risk OTC drugs. Prescription drug management. Decision regarding hospitalization.   ***  {Document critical care time when appropriate:1} {Document review of labs and clinical decision tools ie heart score, Chads2Vasc2 etc:1}  {Document your independent review of radiology images, and any outside records:1} {Document your discussion with  family members, caretakers, and with consultants:1} {Document social determinants of health affecting pt's care:1} {Document your decision making why or why not admission, treatments were needed:1} Final Clinical Impression(s) / ED Diagnoses Final diagnoses:  None    Rx / DC Orders ED Discharge Orders     None

## 2022-10-29 NOTE — ED Triage Notes (Signed)
Pt started having allergic reaction about 20 minutes ago. Pt has no none allergies and had hibachi earlier tonight with shellfish.

## 2022-10-29 NOTE — ED Notes (Signed)
Second Phlebotomist at bedside attempting collections of blood cultures. Delay in starting ABXs ordered
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
## 2022-10-30 ENCOUNTER — Inpatient Hospital Stay (HOSPITAL_COMMUNITY): Payer: Medicaid Other

## 2022-10-30 DIAGNOSIS — T783XXD Angioneurotic edema, subsequent encounter: Secondary | ICD-10-CM

## 2022-10-30 LAB — GLUCOSE, CAPILLARY
Glucose-Capillary: 192 mg/dL — ABNORMAL HIGH (ref 70–99)
Glucose-Capillary: 186 mg/dL — ABNORMAL HIGH (ref 70–99)
Glucose-Capillary: 157 mg/dL — ABNORMAL HIGH (ref 70–99)
Glucose-Capillary: 161 mg/dL — ABNORMAL HIGH (ref 70–99)
Glucose-Capillary: 147 mg/dL — ABNORMAL HIGH (ref 70–99)
Glucose-Capillary: 153 mg/dL — ABNORMAL HIGH (ref 70–99)
Glucose-Capillary: 163 mg/dL — ABNORMAL HIGH (ref 70–99)
Glucose-Capillary: 139 mg/dL — ABNORMAL HIGH (ref 70–99)

## 2022-10-30 LAB — POCT I-STAT 7, (LYTES, BLD GAS, ICA,H+H)
Acid-base deficit: 7 mmol/L — ABNORMAL HIGH (ref 0.0–2.0)
Bicarbonate: 18.5 mmol/L — ABNORMAL LOW (ref 20.0–28.0)
Calcium, Ion: 1.19 mmol/L (ref 1.15–1.40)
HCT: 30 % — ABNORMAL LOW (ref 39.0–52.0)
Hemoglobin: 10.2 g/dL — ABNORMAL LOW (ref 13.0–17.0)
O2 Saturation: 94 %
Patient temperature: 98
Potassium: 4 mmol/L (ref 3.5–5.1)
Sodium: 141 mmol/L (ref 135–145)
TCO2: 20 mmol/L — ABNORMAL LOW (ref 22–32)
pCO2 arterial: 37.5 mmHg (ref 32–48)
pH, Arterial: 7.3 — ABNORMAL LOW (ref 7.35–7.45)
pO2, Arterial: 78 mmHg — ABNORMAL LOW (ref 83–108)

## 2022-10-30 LAB — CBC
HCT: 27.2 % — ABNORMAL LOW (ref 39.0–52.0)
Hemoglobin: 9.2 g/dL — ABNORMAL LOW (ref 13.0–17.0)
MCH: 35.2 pg — ABNORMAL HIGH (ref 26.0–34.0)
MCHC: 33.8 g/dL (ref 30.0–36.0)
MCV: 104.2 fL — ABNORMAL HIGH (ref 80.0–100.0)
Platelets: 234 10*3/uL (ref 150–400)
RBC: 2.61 MIL/uL — ABNORMAL LOW (ref 4.22–5.81)
RDW: 14 % (ref 11.5–15.5)
WBC: 8.4 10*3/uL (ref 4.0–10.5)
nRBC: 0.2 % (ref 0.0–0.2)

## 2022-10-30 LAB — BASIC METABOLIC PANEL
Anion gap: 15 (ref 5–15)
Anion gap: 14 (ref 5–15)
BUN: 18 mg/dL (ref 6–20)
BUN: 21 mg/dL — ABNORMAL HIGH (ref 6–20)
CO2: 17 mmol/L — ABNORMAL LOW (ref 22–32)
CO2: 17 mmol/L — ABNORMAL LOW (ref 22–32)
Calcium: 8.3 mg/dL — ABNORMAL LOW (ref 8.9–10.3)
Calcium: 8.1 mg/dL — ABNORMAL LOW (ref 8.9–10.3)
Chloride: 108 mmol/L (ref 98–111)
Chloride: 108 mmol/L (ref 98–111)
Creatinine, Ser: 2.41 mg/dL — ABNORMAL HIGH (ref 0.61–1.24)
Creatinine, Ser: 2.39 mg/dL — ABNORMAL HIGH (ref 0.61–1.24)
GFR, Estimated: 30 mL/min — ABNORMAL LOW (ref >60)
GFR, Estimated: 31 mL/min — ABNORMAL LOW (ref >60)
Glucose, Bld: 162 mg/dL — ABNORMAL HIGH (ref 70–99)
Glucose, Bld: 171 mg/dL — ABNORMAL HIGH (ref 70–99)
Potassium: 4.5 mmol/L (ref 3.5–5.1)
Potassium: 4.4 mmol/L (ref 3.5–5.1)
Sodium: 140 mmol/L (ref 135–145)
Sodium: 139 mmol/L (ref 135–145)

## 2022-10-30 LAB — HEMOGLOBIN A1C
Hgb A1c MFr Bld: 5.7 % — ABNORMAL HIGH (ref 4.8–5.6)
Mean Plasma Glucose: 116.89 mg/dL

## 2022-10-30 LAB — SEDIMENTATION RATE: Sed Rate: 54 mm/hr — ABNORMAL HIGH (ref 0–16)

## 2022-10-30 LAB — MRSA NEXT GEN BY PCR, NASAL: MRSA by PCR Next Gen: NOT DETECTED

## 2022-10-30 LAB — HEPATIC FUNCTION PANEL
ALT: 32 U/L (ref 0–44)
AST: 47 U/L — ABNORMAL HIGH (ref 15–41)
Albumin: 2.6 g/dL — ABNORMAL LOW (ref 3.5–5.0)
Alkaline Phosphatase: 123 U/L (ref 38–126)
Bilirubin, Direct: <0.1 mg/dL (ref 0.0–0.2)
Total Bilirubin: 0.3 mg/dL (ref 0.3–1.2)
Total Protein: 5.9 g/dL — ABNORMAL LOW (ref 6.5–8.1)

## 2022-10-30 LAB — PHOSPHORUS
Phosphorus: 5.8 mg/dL — ABNORMAL HIGH (ref 2.5–4.6)
Phosphorus: 6 mg/dL — ABNORMAL HIGH (ref 2.5–4.6)

## 2022-10-30 LAB — MAGNESIUM: Magnesium: 1.6 mg/dL — ABNORMAL LOW (ref 1.7–2.4)

## 2022-10-30 LAB — C-REACTIVE PROTEIN: CRP: 2 mg/dL — ABNORMAL HIGH (ref <1.0)

## 2022-10-30 MED ORDER — DOCUSATE SODIUM 50 MG/5ML PO LIQD: 100.0000 mg | Freq: Two times a day (BID) | ORAL | Status: DC | PRN

## 2022-10-30 MED ORDER — DIPHENHYDRAMINE HCL 50 MG/ML IJ SOLN
25.0000 mg | Freq: Three times a day (TID) | INTRAMUSCULAR | Status: DC | PRN
  Administered 2022-11-01: 25 mg via INTRAVENOUS
  Filled 2022-10-30: qty 1

## 2022-10-30 MED ORDER — FAMOTIDINE IN NACL 20-0.9 MG/50ML-% IV SOLN
20.0000 mg | Freq: Two times a day (BID) | INTRAVENOUS | Status: DC
  Administered 2022-10-30 – 2022-11-01 (×7): 20 mg via INTRAVENOUS
  Filled 2022-10-30 (×6): qty 50

## 2022-10-30 MED ORDER — ORAL CARE MOUTH RINSE
15.0000 mL | OROMUCOSAL | Status: DC
  Administered 2022-10-30 – 2022-11-13 (×171): 15 mL via OROMUCOSAL

## 2022-10-30 MED ORDER — POLYETHYLENE GLYCOL 3350 17 G PO PACK: 17.0000 g | PACK | Freq: Every day | ORAL | Status: DC | PRN

## 2022-10-30 MED ORDER — ORAL CARE MOUTH RINSE: 15.0000 mL | OROMUCOSAL | Status: DC | PRN

## 2022-10-30 MED ORDER — INSULIN ASPART 100 UNIT/ML IJ SOLN
0.0000 [IU] | INTRAMUSCULAR | Status: DC
  Administered 2022-10-30: 1 [IU] via SUBCUTANEOUS
  Administered 2022-10-30: 2 [IU] via SUBCUTANEOUS
  Administered 2022-10-30: 1 [IU] via SUBCUTANEOUS
  Administered 2022-10-30 – 2022-10-31 (×5): 2 [IU] via SUBCUTANEOUS
  Administered 2022-10-31: 1 [IU] via SUBCUTANEOUS
  Administered 2022-10-31 (×2): 2 [IU] via SUBCUTANEOUS
  Administered 2022-11-01 (×2): 1 [IU] via SUBCUTANEOUS
  Administered 2022-11-01: 2 [IU] via SUBCUTANEOUS
  Administered 2022-11-01 (×3): 1 [IU] via SUBCUTANEOUS
  Administered 2022-11-01 – 2022-11-02 (×2): 2 [IU] via SUBCUTANEOUS
  Administered 2022-11-02 (×3): 1 [IU] via SUBCUTANEOUS
  Administered 2022-11-02 – 2022-11-03 (×3): 2 [IU] via SUBCUTANEOUS
  Administered 2022-11-03: 1 [IU] via SUBCUTANEOUS
  Administered 2022-11-03: 2 [IU] via SUBCUTANEOUS
  Administered 2022-11-04 (×3): 1 [IU] via SUBCUTANEOUS
  Administered 2022-11-05: 3 [IU] via SUBCUTANEOUS
  Administered 2022-11-05 (×3): 1 [IU] via SUBCUTANEOUS
  Administered 2022-11-05 – 2022-11-06 (×2): 2 [IU] via SUBCUTANEOUS
  Administered 2022-11-06: 1 [IU] via SUBCUTANEOUS
  Administered 2022-11-06: 2 [IU] via SUBCUTANEOUS
  Administered 2022-11-06: 1 [IU] via SUBCUTANEOUS
  Administered 2022-11-06: 3 [IU] via SUBCUTANEOUS
  Administered 2022-11-06 – 2022-11-07 (×7): 2 [IU] via SUBCUTANEOUS
  Administered 2022-11-07: 3 [IU] via SUBCUTANEOUS
  Administered 2022-11-08 (×3): 2 [IU] via SUBCUTANEOUS

## 2022-10-30 MED ORDER — CHLORHEXIDINE GLUCONATE CLOTH 2 % EX PADS
6.0000 | MEDICATED_PAD | Freq: Every day | CUTANEOUS | Status: DC
  Administered 2022-10-30 – 2022-11-08 (×12): 6 via TOPICAL

## 2022-10-30 MED ORDER — MAGNESIUM SULFATE 4 GM/100ML IV SOLN
4.0000 g | Freq: Once | INTRAVENOUS | Status: AC
  Administered 2022-10-30: 4 g via INTRAVENOUS
  Filled 2022-10-30: qty 100

## 2022-10-30 MED ORDER — METHYLPREDNISOLONE SODIUM SUCC 125 MG IJ SOLR
80.0000 mg | INTRAMUSCULAR | Status: DC
  Administered 2022-10-30 – 2022-11-01 (×3): 80 mg via INTRAVENOUS
  Filled 2022-10-30 (×3): qty 2

## 2022-10-30 MED ORDER — HEPARIN SODIUM (PORCINE) 5000 UNIT/ML IJ SOLN
5000.0000 [IU] | Freq: Three times a day (TID) | INTRAMUSCULAR | Status: DC
  Administered 2022-10-30 – 2022-11-13 (×43): 5000 [IU] via SUBCUTANEOUS
  Filled 2022-10-30 (×40): qty 1

## 2022-10-30 MED ORDER — HEPARIN SODIUM (PORCINE) 5000 UNIT/ML IJ SOLN
INTRAMUSCULAR | Status: AC
  Administered 2022-10-30: 5000 [IU]
  Filled 2022-10-30: qty 1

## 2022-10-30 MED ORDER — PROSOURCE TF20 ENFIT COMPATIBL EN LIQD
60.0000 mL | Freq: Every day | ENTERAL | Status: DC
  Administered 2022-10-30 – 2022-11-08 (×10): 60 mL
  Filled 2022-10-30 (×10): qty 60

## 2022-10-30 MED ORDER — FAMOTIDINE IN NACL 20-0.9 MG/50ML-% IV SOLN
INTRAVENOUS | Status: AC
  Filled 2022-10-30: qty 50

## 2022-10-30 MED ORDER — VITAL AF 1.2 CAL PO LIQD
1000.0000 mL | ORAL | Status: DC
  Administered 2022-10-30 – 2022-11-08 (×8): 1000 mL

## 2022-10-30 NOTE — TOC CM/SW Note (Signed)
Transition of Care [facility]) - Inpatient Brief Assessment   Patient Details  Name: Austin Berry MRN: [id number] Date of Birth: October 12, 1965  [facility] CM/SW Contact:    Tom-Johnson, Hershal Coria, RN Phone Number: 10/30/2022, 1:18 PM   Clinical Narrative:  Patient presented to [facility] with a large  Swollen Tongue after eating Hibachi Shrimp and Chicken. Patient was intubated and then transferred to [facility] ICU for further management and evaluation.  Patient is currently intubated and sedated. On IV abx and Solumedrol.  No TOC needs or recommendations noted at this time. CM will continue to follow as patient progresses with care towards discharge.     Transition of Care Asessment: Insurance and Status: Insurance coverage has been reviewed Patient has primary care physician: Yes Home environment has been reviewed: Yes Prior level of function:: Modified independence- Blind Prior/Current Home Services: No current home services Social Determinants of Health Reivew: SDOH reviewed no interventions necessary Readmission risk has been reviewed: Yes Transition of care needs: transition of care needs identified, TOC will continue to follow

## 2022-10-30 NOTE — Progress Notes (Addendum)
Initial Nutrition Assessment  DOCUMENTATION CODES:  Not applicable  INTERVENTION:  Initiate tube feeding via OGT: Vital AF at 55 ml/h (1320 ml per day) Start at 35 and advance by 10mL q4h to goal Prosource TF20 60 ml 1x/d Provides 1664 kcal, 119 gm protein, 1071 ml free water daily TF+propofol = 2348 kcal/d If propofol weaned, recommend Vital 1.5 @ 60 with 1 prosource TF daily which would provide 2240kcal and 117g protein. Continue thiamine for hx of EtOH abuse  NUTRITION DIAGNOSIS:   Inadequate oral intake related to inability to eat as evidenced by NPO status.  GOAL:   Patient will meet greater than or equal to 90% of their needs  MONITOR:   TF tolerance, I & O's, Vent status, Labs  REASON FOR ASSESSMENT:   Consult Enteral/tube feeding initiation and management  ASSESSMENT:   Pt with hx of HTN, DM, EtOH abuse, and blindness presented to [facility] ED with possible allergic reaction and tongue swelling. Intubated in ED and transferred to [facility] for management  7/30 - Presented to ED, intubated 7/31 - transferred to [facility]   Patient is currently intubated on ventilator support. OGT in place confirmed gastric on XR. No family at bedside at the time of assessment to provide a nutrition hx. On exam upper half of body appears well nourished but lower body shows signs of muscle depletions.   Pt receiving a significant amount of propofol. Discussed with RN, no plans to change sedation or lower rate at this time. Will account for kcal from propofol when administering TF. Recommendations above in the even sedation is changed.   MV: 11.2 L/min Temp (24hrs), Avg:97.6 F (36.4 C), Min:97 F (36.1 C), Max:98.4 F (36.9 C)  Propofol: 25.9 ml/hr (684 kcal/d at current infusion rate)   Intake/Output Summary (Last 24 hours) at 10/30/2022 1443 Last data filed at 10/30/2022 1400 Gross per 24 hour  Intake 3438.27 ml  Output 1015 ml  Net 2423.27 ml  Net IO Since Admission:  2,423.27 mL [10/30/22 1443]  Nutritionally Relevant Medications: Scheduled Meds:  docusate  100 mg BID   folic acid  1 mg Daily   insulin aspart  0-9 Units Q4H   methylPREDNISolone   80 mg Q24H   polyethylene glycol  17 g Daily   thiamine  100 mg Daily   Continuous Infusions:  sodium chloride 125 mL/hr    fentaNYL infusion INTRAVENOUS 275 mcg/hr    propofol (DIPRIVAN) infusion 50 mcg/kg/min   PRN Meds: diphenhydrAMINE, docusate, polyethylene glycol  Labs Reviewed: BUN 21, creatinine 2.39 Phosphorus 6.0 Mg 1.6 CBG ranges from 147-192 mg/dL over the last 24 hours HgbA1c 5.7%  NUTRITION - FOCUSED PHYSICAL EXAM: Flowsheet Row Most Recent Value  Orbital Region No depletion  Upper Arm Region No depletion  Thoracic and Lumbar Region No depletion  Buccal Region No depletion  Temple Region No depletion  Clavicle Bone Region No depletion  Clavicle and Acromion Bone Region No depletion  Scapular Bone Region No depletion  Dorsal Hand Unable to assess  [mittens]  Patellar Region Severe depletion  Anterior Thigh Region Severe depletion  Posterior Calf Region Moderate depletion  Edema (RD Assessment) Mild  [BLE]  Hair Reviewed  Eyes Reviewed  Mouth Reviewed  Skin Reviewed  Nails Reviewed    Diet Order:   Diet Order             Diet NPO time specified  Diet effective now  EDUCATION NEEDS:   Not appropriate for education at this time  Skin:  Skin Assessment: Reviewed RN Assessment  Last BM:  unsure  Height:   Ht Readings from Last 1 Encounters:  10/30/22 6' 0.01" (1.829 m)    Weight:   Wt Readings from Last 1 Encounters:  10/30/22 87.2 kg    Ideal Body Weight:  80.9 kg  BMI:  Body mass index is 26.07 kg/m.  Estimated Nutritional Needs:   Kcal:  2100-2300 kcal/d  Protein:  105-120g/d  Fluid:  2.2L/d    Greig Castilla, RD, LDN Clinical Dietitian RD pager # available in AMION  After hours/weekend pager # available in  [facility]

## 2022-10-30 NOTE — Plan of Care (Signed)
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
## 2022-10-30 NOTE — Progress Notes (Incomplete)
Attending note: I have seen and examined the patient. History, labs and imaging reviewed.  57 Y/O with H/O HTN, DM admitted with angioedema due to either ACEI or shrimp. Orally intubated in ED and given pepcid, solumedrol  Blood pressure (!) 140/84, pulse (!) 52, temperature (!) 97.5 F (36.4 C), temperature source Oral, resp. rate 18, height 6' 0.01" (1.829 m), weight 84.4 kg, SpO2 97%. Gen:      No acute distress HEENT:  EOMI, sclera anicteric Neck:     No masses; no thyromegaly Lungs:    Clear to auscultation bilaterally; normal respiratory effort*** CV:         Regular rate and rhythm; no murmurs Abd:      + bowel sounds; soft, non-tender; no palpable masses, no distension Ext:    No edema; adequate peripheral perfusion Skin:      Warm and dry; no rash Neuro: alert and oriented x 3 Psych: normal mood and affect   Labs/Imaging personally reviewed, significant for Co2 17, Cr 2.39, phos 6.0, AST 47, ALT 32 Chest x ray with   Assessment/plan: Acute hypoxic resp failure due to angio edema SBTs when tolerated  Angioedema Steroids, pepcid, benadryl  AKI on CKD Monitor urine output and Cr  Alcohol abuse Monitor for withdrawal  The patient is critically ill with multiple organ systems failure and requires high complexity decision making for assessment and support, frequent evaluation and titration of therapies, application of advanced monitoring technologies and extensive interpretation of multiple databases.  Critical care time - 35 mins. This represents my time independent of the NPs time taking care of the pt.  [name] MD [facility] 10/30/2022, 8:44 AM

## 2022-10-30 NOTE — Plan of Care (Signed)
Angioedema Pt remains with periorbital and facial edema -continue steroids, pepcid, benadryl -hold off on SAT/SBT today and trial if/when swelling improves

## 2022-10-30 NOTE — Progress Notes (Signed)
eLink Physician-Brief Progress Note Patient Name: EVAGELOS CERNEY DOB: [birth year] MRN: [id number]   Date of Service  10/30/2022  HPI/Events of Note  Patient is transferred from [facility] wit angioedema presumed secondary to ACE inhibitor, and acute respiratory failure requiring intubation.  eICU Interventions  New Patient Evaluation.         U  10/30/2022, 1:26 AM

## 2022-10-30 NOTE — Progress Notes (Signed)
eLink Physician-Brief Progress Note Patient Name: Austin Berry DOB: 09-Apr-1965 MRN: [id number]   Date of Service  10/30/2022  HPI/Events of Note  ABG results reviewed.  eICU Interventions          Migdalia Dk 10/30/2022, 4:14 AM

## 2022-10-31 ENCOUNTER — Ambulatory Visit: Payer: Medicaid Other | Admitting: Physician Assistant

## 2022-10-31 DIAGNOSIS — T783XXD Angioneurotic edema, subsequent encounter: Secondary | ICD-10-CM | POA: Diagnosis not present

## 2022-10-31 LAB — POCT I-STAT 7, (LYTES, BLD GAS, ICA,H+H)
Acid-base deficit: 8 mmol/L — ABNORMAL HIGH (ref 0.0–2.0)
Bicarbonate: 17.4 mmol/L — ABNORMAL LOW (ref 20.0–28.0)
Calcium, Ion: 1.1 mmol/L — ABNORMAL LOW (ref 1.15–1.40)
HCT: 43 % (ref 39.0–52.0)
Hemoglobin: 14.6 g/dL (ref 13.0–17.0)
O2 Saturation: 85 %
Patient temperature: 96.1
Potassium: 3.8 mmol/L (ref 3.5–5.1)
Sodium: 142 mmol/L (ref 135–145)
TCO2: 18 mmol/L — ABNORMAL LOW (ref 22–32)
pCO2 arterial: 31.9 mmHg — ABNORMAL LOW (ref 32–48)
pH, Arterial: 7.337 — ABNORMAL LOW (ref 7.35–7.45)
pO2, Arterial: 49 mmHg — ABNORMAL LOW (ref 83–108)

## 2022-10-31 LAB — GLUCOSE, CAPILLARY
Glucose-Capillary: 153 mg/dL — ABNORMAL HIGH (ref 70–99)
Glucose-Capillary: 140 mg/dL — ABNORMAL HIGH (ref 70–99)
Glucose-Capillary: 180 mg/dL — ABNORMAL HIGH (ref 70–99)
Glucose-Capillary: 193 mg/dL — ABNORMAL HIGH (ref 70–99)
Glucose-Capillary: 155 mg/dL — ABNORMAL HIGH (ref 70–99)
Glucose-Capillary: 133 mg/dL — ABNORMAL HIGH (ref 70–99)

## 2022-10-31 LAB — URINALYSIS, W/ REFLEX TO CULTURE (INFECTION SUSPECTED)
Bilirubin Urine: NEGATIVE
Glucose, UA: NEGATIVE mg/dL
Hgb urine dipstick: NEGATIVE
Ketones, ur: NEGATIVE mg/dL
Nitrite: NEGATIVE
Protein, ur: NEGATIVE mg/dL
Specific Gravity, Urine: 1.008 (ref 1.005–1.030)
pH: 5 (ref 5.0–8.0)

## 2022-10-31 LAB — TRIGLYCERIDES: Triglycerides: 309 mg/dL — ABNORMAL HIGH (ref <150)

## 2022-10-31 MED ORDER — FENTANYL BOLUS VIA INFUSION
50.0000 ug | INTRAVENOUS | Status: DC | PRN
  Administered 2022-10-31 – 2022-11-01 (×6): 100 ug via INTRAVENOUS

## 2022-10-31 MED ORDER — PHENOBARBITAL 97.2 MG PO TABS
97.2000 mg | ORAL_TABLET | Freq: Three times a day (TID) | ORAL | Status: DC
  Administered 2022-10-31 – 2022-11-01 (×3): 97.2 mg
  Filled 2022-10-31 (×3): qty 1

## 2022-10-31 MED ORDER — LORAZEPAM 1 MG PO TABS: 1.0000 mg | ORAL_TABLET | ORAL | Status: DC | PRN

## 2022-10-31 MED ORDER — SODIUM CHLORIDE 0.9 % IV SOLN
2.0000 g | Freq: Two times a day (BID) | INTRAVENOUS | Status: DC
  Administered 2022-10-31 – 2022-11-01 (×3): 2 g via INTRAVENOUS
  Filled 2022-10-31 (×3): qty 12.5

## 2022-10-31 MED ORDER — PHENOBARBITAL 32.4 MG PO TABS: 64.8000 mg | ORAL_TABLET | Freq: Three times a day (TID) | ORAL | Status: DC

## 2022-10-31 MED ORDER — SODIUM CHLORIDE 0.9 % IV SOLN
260.0000 mg | Freq: Once | INTRAVENOUS | Status: AC
  Administered 2022-10-31: 260 mg via INTRAVENOUS
  Filled 2022-10-31: qty 2

## 2022-10-31 MED ORDER — PHENOBARBITAL 32.4 MG PO TABS: 32.4000 mg | ORAL_TABLET | Freq: Three times a day (TID) | ORAL | Status: DC

## 2022-10-31 MED ORDER — SODIUM CHLORIDE 0.9 % IV SOLN
2.0000 g | INTRAVENOUS | Status: DC
  Administered 2022-10-31: 2 g via INTRAVENOUS
  Filled 2022-10-31: qty 20

## 2022-10-31 NOTE — Plan of Care (Signed)

## 2022-10-31 NOTE — Progress Notes (Signed)
PHARMACY - PHYSICIAN COMMUNICATION CRITICAL VALUE ALERT - BLOOD CULTURE IDENTIFICATION (BCID)  DERBY WITTMAN is an 57 y.o. male who presented to [facility] on 10/29/2022 with a chief complaint of angioedema   Assessment: blood cultures 1/2 aerobic bottle growing GNR enterobacteriales detected on BCID but is not one of the species tested for. No resistance detected.   Name of physician (or Provider) Contacted: Dr. Tonia Brooms  Current antibiotics: none  Changes to prescribed antibiotics recommended:  Recommendations accepted by provider - start ceftriaxone   No results found for this or any previous visit.  Alphia Moh, PharmD, BCPS, BCCP Clinical Pharmacist  Please check AMION for all [facility] Pharmacy phone numbers After 10:00 PM, call Main Pharmacy [phone]
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
## 2022-10-31 DEATH — deceased

## 2022-11-01 ENCOUNTER — Inpatient Hospital Stay (HOSPITAL_COMMUNITY): Payer: Medicaid Other

## 2022-11-01 DIAGNOSIS — T783XXD Angioneurotic edema, subsequent encounter: Secondary | ICD-10-CM | POA: Diagnosis not present

## 2022-11-01 LAB — POCT I-STAT 7, (LYTES, BLD GAS, ICA,H+H)
Acid-base deficit: 10 mmol/L — ABNORMAL HIGH (ref 0.0–2.0)
Bicarbonate: 16.9 mmol/L — ABNORMAL LOW (ref 20.0–28.0)
Calcium, Ion: 1.12 mmol/L — ABNORMAL LOW (ref 1.15–1.40)
HCT: 29 % — ABNORMAL LOW (ref 39.0–52.0)
Hemoglobin: 9.9 g/dL — ABNORMAL LOW (ref 13.0–17.0)
O2 Saturation: 94 %
Patient temperature: 96.3
Potassium: 4.3 mmol/L (ref 3.5–5.1)
Sodium: 142 mmol/L (ref 135–145)
TCO2: 18 mmol/L — ABNORMAL LOW (ref 22–32)
pCO2 arterial: 38 mmHg (ref 32–48)
pH, Arterial: 7.249 — ABNORMAL LOW (ref 7.35–7.45)
pO2, Arterial: 77 mmHg — ABNORMAL LOW (ref 83–108)

## 2022-11-01 LAB — GLUCOSE, CAPILLARY
Glucose-Capillary: 147 mg/dL — ABNORMAL HIGH (ref 70–99)
Glucose-Capillary: 178 mg/dL — ABNORMAL HIGH (ref 70–99)
Glucose-Capillary: 148 mg/dL — ABNORMAL HIGH (ref 70–99)
Glucose-Capillary: 197 mg/dL — ABNORMAL HIGH (ref 70–99)
Glucose-Capillary: 162 mg/dL — ABNORMAL HIGH (ref 70–99)
Glucose-Capillary: 130 mg/dL — ABNORMAL HIGH (ref 70–99)

## 2022-11-01 MED ORDER — RACEPINEPHRINE HCL 2.25 % IN NEBU
0.5000 mL | INHALATION_SOLUTION | Freq: Once | RESPIRATORY_TRACT | Status: AC
  Administered 2022-11-01: 0.5 mL via RESPIRATORY_TRACT
  Filled 2022-11-01: qty 0.5

## 2022-11-01 MED ORDER — PHENYLEPHRINE 80 MCG/ML (10ML) SYRINGE FOR IV PUSH (FOR BLOOD PRESSURE SUPPORT)
PREFILLED_SYRINGE | INTRAVENOUS | Status: AC
  Filled 2022-11-01: qty 10

## 2022-11-01 MED ORDER — DEXMEDETOMIDINE HCL IN NACL 400 MCG/100ML IV SOLN
0.0000 ug/kg/h | INTRAVENOUS | Status: DC
  Administered 2022-11-01: 1.2 ug/kg/h via INTRAVENOUS
  Filled 2022-11-01: qty 100

## 2022-11-01 MED ORDER — METHYLPREDNISOLONE SODIUM SUCC 40 MG IJ SOLR: 40.0000 mg | INTRAMUSCULAR | Status: DC

## 2022-11-01 MED ORDER — ROCURONIUM BROMIDE 10 MG/ML (PF) SYRINGE: 100.0000 mg | PREFILLED_SYRINGE | Freq: Once | INTRAVENOUS | Status: AC

## 2022-11-01 MED ORDER — ETOMIDATE 2 MG/ML IV SOLN
INTRAVENOUS | Status: AC
  Administered 2022-11-01: 20 mg via INTRAVENOUS
  Filled 2022-11-01: qty 10

## 2022-11-01 MED ORDER — RACEPINEPHRINE HCL 2.25 % IN NEBU: 0.5000 mL | INHALATION_SOLUTION | RESPIRATORY_TRACT | Status: DC | PRN

## 2022-11-01 MED ORDER — MIDAZOLAM HCL 2 MG/2ML IJ SOLN
INTRAMUSCULAR | Status: AC
  Administered 2022-11-01: 2 mg via INTRAVENOUS
  Filled 2022-11-01: qty 2

## 2022-11-01 MED ORDER — ETOMIDATE 2 MG/ML IV SOLN: 20.0000 mg | Freq: Once | INTRAVENOUS | Status: AC

## 2022-11-01 MED ORDER — PHENOBARBITAL SODIUM 65 MG/ML IJ SOLN
65.0000 mg | Freq: Three times a day (TID) | INTRAMUSCULAR | Status: AC
  Administered 2022-11-03 – 2022-11-04 (×5): 65 mg via INTRAVENOUS
  Filled 2022-11-01 (×5): qty 1

## 2022-11-01 MED ORDER — PHENOBARBITAL SODIUM 130 MG/ML IJ SOLN
97.5000 mg | Freq: Three times a day (TID) | INTRAMUSCULAR | Status: AC
  Administered 2022-11-01: 97.5 mg via INTRAVENOUS
  Filled 2022-11-01 (×3): qty 1

## 2022-11-01 MED ORDER — PHENOBARBITAL SODIUM 65 MG/ML IJ SOLN
32.5000 mg | Freq: Three times a day (TID) | INTRAMUSCULAR | Status: AC
  Administered 2022-11-05 – 2022-11-06 (×6): 32.5 mg via INTRAVENOUS
  Filled 2022-11-01 (×6): qty 1

## 2022-11-01 MED ORDER — MIDAZOLAM HCL 2 MG/2ML IJ SOLN: 4.0000 mg | Freq: Once | INTRAMUSCULAR | Status: AC

## 2022-11-01 MED ORDER — PROPOFOL 1000 MG/100ML IV EMUL
5.0000 ug/kg/min | INTRAVENOUS | Status: DC
  Administered 2022-11-01 (×2): 60 ug/kg/min via INTRAVENOUS
  Administered 2022-11-02: 70 ug/kg/min via INTRAVENOUS
  Administered 2022-11-02 (×2): 50 ug/kg/min via INTRAVENOUS
  Administered 2022-11-02: 60 ug/kg/min via INTRAVENOUS
  Administered 2022-11-02: 40 ug/kg/min via INTRAVENOUS
  Administered 2022-11-02: 60 ug/kg/min via INTRAVENOUS
  Administered 2022-11-03: 40 ug/kg/min via INTRAVENOUS
  Administered 2022-11-03: 20 ug/kg/min via INTRAVENOUS
  Administered 2022-11-03: 40 ug/kg/min via INTRAVENOUS
  Administered 2022-11-04: 20 ug/kg/min via INTRAVENOUS
  Administered 2022-11-04: 40 ug/kg/min via INTRAVENOUS
  Administered 2022-11-04: 20 ug/kg/min via INTRAVENOUS
  Administered 2022-11-05: 25 ug/kg/min via INTRAVENOUS
  Administered 2022-11-05: 30 ug/kg/min via INTRAVENOUS
  Administered 2022-11-05: 10 ug/kg/min via INTRAVENOUS
  Administered 2022-11-05 – 2022-11-06 (×3): 30 ug/kg/min via INTRAVENOUS
  Administered 2022-11-06: 35 ug/kg/min via INTRAVENOUS
  Administered 2022-11-06 (×2): 30 ug/kg/min via INTRAVENOUS
  Administered 2022-11-07 (×3): 60 ug/kg/min via INTRAVENOUS
  Administered 2022-11-07: 70 ug/kg/min via INTRAVENOUS
  Administered 2022-11-07: 40 ug/kg/min via INTRAVENOUS
  Administered 2022-11-08: 65 ug/kg/min via INTRAVENOUS
  Filled 2022-11-01: qty 100
  Filled 2022-11-01: qty 200
  Filled 2022-11-01 (×9): qty 100
  Filled 2022-11-01: qty 200
  Filled 2022-11-01 (×11): qty 100
  Filled 2022-11-01: qty 200
  Filled 2022-11-01 (×3): qty 100

## 2022-11-01 MED ORDER — SODIUM CHLORIDE 0.9 % IV SOLN
2.0000 g | INTRAVENOUS | Status: DC
  Administered 2022-11-01 – 2022-11-04 (×4): 2 g via INTRAVENOUS
  Filled 2022-11-01 (×4): qty 20

## 2022-11-01 MED ORDER — FENTANYL 2500MCG IN NS 250ML (10MCG/ML) PREMIX INFUSION
INTRAVENOUS | Status: AC
  Administered 2022-11-01: 300 ug/h via INTRAVENOUS
  Filled 2022-11-01: qty 250

## 2022-11-01 MED ORDER — MIDAZOLAM HCL 2 MG/2ML IJ SOLN
INTRAMUSCULAR | Status: AC
  Administered 2022-11-01: 2 mg via INTRAVENOUS
  Filled 2022-11-01: qty 4

## 2022-11-01 MED ORDER — FENTANYL 2500MCG IN NS 250ML (10MCG/ML) PREMIX INFUSION
0.0000 ug/h | INTRAVENOUS | Status: DC
  Administered 2022-11-02: 250 ug/h via INTRAVENOUS
  Administered 2022-11-02: 350 ug/h via INTRAVENOUS
  Administered 2022-11-02: 300 ug/h via INTRAVENOUS
  Administered 2022-11-02: 260 ug/h via INTRAVENOUS
  Administered 2022-11-03: 250 ug/h via INTRAVENOUS
  Administered 2022-11-03 – 2022-11-04 (×2): 200 ug/h via INTRAVENOUS
  Administered 2022-11-04: 300 ug/h via INTRAVENOUS
  Administered 2022-11-04: 250 ug/h via INTRAVENOUS
  Administered 2022-11-05: 300 ug/h via INTRAVENOUS
  Administered 2022-11-05: 350 ug/h via INTRAVENOUS
  Administered 2022-11-05 – 2022-11-06 (×4): 300 ug/h via INTRAVENOUS
  Administered 2022-11-07 (×2): 400 ug/h via INTRAVENOUS
  Administered 2022-11-07: 300 ug/h via INTRAVENOUS
  Administered 2022-11-07 – 2022-11-08 (×2): 400 ug/h via INTRAVENOUS
  Filled 2022-11-01 (×20): qty 250

## 2022-11-01 MED ORDER — FENTANYL BOLUS VIA INFUSION
50.0000 ug | INTRAVENOUS | Status: DC | PRN
  Administered 2022-11-01 – 2022-11-02 (×5): 100 ug via INTRAVENOUS
  Administered 2022-11-03 (×2): 50 ug via INTRAVENOUS
  Administered 2022-11-03 – 2022-11-04 (×3): 100 ug via INTRAVENOUS
  Administered 2022-11-04: 50 ug via INTRAVENOUS
  Administered 2022-11-04 – 2022-11-05 (×5): 100 ug via INTRAVENOUS
  Administered 2022-11-05: 50 ug via INTRAVENOUS
  Administered 2022-11-06 (×2): 100 ug via INTRAVENOUS
  Administered 2022-11-06 (×2): 50 ug via INTRAVENOUS
  Administered 2022-11-07 (×7): 100 ug via INTRAVENOUS
  Administered 2022-11-07: 50 ug via INTRAVENOUS
  Administered 2022-11-07 (×2): 100 ug via INTRAVENOUS
  Administered 2022-11-07: 75 ug via INTRAVENOUS
  Administered 2022-11-07 – 2022-11-09 (×8): 100 ug via INTRAVENOUS
  Administered 2022-11-09: 50 ug via INTRAVENOUS
  Administered 2022-11-09 – 2022-11-10 (×3): 100 ug via INTRAVENOUS

## 2022-11-01 MED ORDER — DEXMEDETOMIDINE HCL IN NACL 400 MCG/100ML IV SOLN
INTRAVENOUS | Status: AC
  Filled 2022-11-01: qty 100

## 2022-11-01 MED ORDER — PROPOFOL 1000 MG/100ML IV EMUL
INTRAVENOUS | Status: AC
  Administered 2022-11-01: 60 ug/kg/min via INTRAVENOUS
  Filled 2022-11-01: qty 100

## 2022-11-01 MED ORDER — METHYLPREDNISOLONE SODIUM SUCC 40 MG IJ SOLR: 20.0000 mg | INTRAMUSCULAR | Status: DC

## 2022-11-01 MED ORDER — ROCURONIUM BROMIDE 10 MG/ML (PF) SYRINGE
PREFILLED_SYRINGE | INTRAVENOUS | Status: AC
  Administered 2022-11-01: 100 mg via INTRAVENOUS
  Filled 2022-11-01: qty 10

## 2022-11-01 NOTE — Progress Notes (Signed)
Pt agitated and combative. Pt on 15L HFC and o2 sats 85-88% with difficulty clearing secretions. NT suctioning performed with little improvement. Pt remains on precedex drip, but also requiring PRN Versed Q1hr at this time. Md notified and awaiting new orders.

## 2022-11-01 NOTE — Procedures (Signed)
Extubation Procedure Note  Patient Details:   Name: Austin Berry DOB: Dec 15, 1965 MRN: [id number]   Airway Documentation:    Vent end date: 11/01/22 Vent end time: 1105   Evaluation  O2 sats: transiently fell during during procedure Complications: No apparent complications Patient did tolerate procedure well. Bilateral Breath Sounds: Clear   No   Idelle Leech 11/01/2022, 11:14 AM

## 2022-11-01 NOTE — Progress Notes (Signed)
eLink Physician-Brief Progress Note Patient Name: Austin Berry DOB: 04-Apr-1965 MRN: [id number]   Date of Service  11/01/2022  HPI/Events of Note  ABG reviewed. 7.24/77/18 Metabolic acidosis. Renal function improving No pressor requirement  eICU Interventions  No action needed at this time     Intervention Category Intermediate Interventions: Diagnostic test evaluation   [name] 11/01/2022, 9:30 PM

## 2022-11-01 NOTE — Progress Notes (Signed)
PCCM note  Patient extubated today after he woke up on weaning trial and had a blown cuff.  He seemed to do okay for a bit but got increasingly agitated, delirious on maximal Precedex drip and Versed as needed.  Suspect his blindness may have led to disorientation and delirium and he is in full-blown alcohol withdrawal Will plan on reintubation.  Wife updated at bedside.  Additional CC time- 30 mins [name] MD [place] See Amion for pager  If no response to pager , please call [phone] until 7pm After 7:00 pm call Elink  [phone] 11/01/2022, 3:05 PM
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
## 2022-11-01 NOTE — Procedures (Signed)
Intubation Procedure Note  Austin Berry  [id number]  Jan 02, 1966  Date:11/01/22  Time:3:06 PM   Provider Performing:     Procedure: Intubation (31500)  Indication(s) Respiratory Failure  Consent Unable to obtain consent due to emergent nature of procedure.   Anesthesia Etomidate and Rocuronium   Time Out Verified patient identification, verified procedure, site/side was marked, verified correct patient position, special equipment/implants available, medications/allergies/relevant history reviewed, required imaging and test results available.   Sterile Technique Usual hand hygeine, masks, and gloves were used   Procedure Description Patient positioned in bed supine.  Sedation given as noted above.  Patient was intubated with endotracheal tube using Glidescope.  View was Grade 1 full glottis .  Number of attempts was 1.  Colorimetric CO2 detector was consistent with tracheal placement.   Complications/Tolerance None; patient tolerated the procedure well. Chest X-ray is ordered to verify placement.   EBL Minimal   Specimen(s) None  [name] MD [place] [facility] See Amion for pager  If no response to pager , please call [phone] until 7pm After 7:00 pm call Elink  [phone] 11/01/2022, 3:07 PM

## 2022-11-02 ENCOUNTER — Inpatient Hospital Stay (HOSPITAL_COMMUNITY): Payer: Medicaid Other

## 2022-11-02 DIAGNOSIS — T783XXA Angioneurotic edema, initial encounter: Secondary | ICD-10-CM

## 2022-11-02 DIAGNOSIS — J189 Pneumonia, unspecified organism: Secondary | ICD-10-CM | POA: Diagnosis not present

## 2022-11-02 DIAGNOSIS — J9601 Acute respiratory failure with hypoxia: Secondary | ICD-10-CM

## 2022-11-02 LAB — GLUCOSE, CAPILLARY
Glucose-Capillary: 129 mg/dL — ABNORMAL HIGH (ref 70–99)
Glucose-Capillary: 132 mg/dL — ABNORMAL HIGH (ref 70–99)
Glucose-Capillary: 172 mg/dL — ABNORMAL HIGH (ref 70–99)
Glucose-Capillary: 179 mg/dL — ABNORMAL HIGH (ref 70–99)
Glucose-Capillary: 156 mg/dL — ABNORMAL HIGH (ref 70–99)
Glucose-Capillary: 147 mg/dL — ABNORMAL HIGH (ref 70–99)

## 2022-11-02 LAB — TRIGLYCERIDES: Triglycerides: 342 mg/dL — ABNORMAL HIGH (ref <150)

## 2022-11-02 MED ORDER — SERTRALINE HCL 100 MG PO TABS
100.0000 mg | ORAL_TABLET | Freq: Every day | ORAL | Status: DC
  Administered 2022-11-02 – 2022-11-13 (×12): 100 mg
  Filled 2022-11-02 (×12): qty 1

## 2022-11-02 MED ORDER — POLYVINYL ALCOHOL 1.4 % OP SOLN
2.0000 [drp] | OPHTHALMIC | Status: DC | PRN
  Administered 2022-11-02 – 2022-11-09 (×4): 2 [drp] via OPHTHALMIC
  Filled 2022-11-02: qty 15

## 2022-11-02 MED ORDER — MELATONIN 5 MG PO TABS
5.0000 mg | ORAL_TABLET | Freq: Every day | ORAL | Status: DC
  Administered 2022-11-02 – 2022-11-09 (×8): 5 mg
  Filled 2022-11-02 (×8): qty 1

## 2022-11-02 MED ORDER — POLYETHYLENE GLYCOL 3350 17 G PO PACK
17.0000 g | PACK | Freq: Two times a day (BID) | ORAL | Status: DC
  Administered 2022-11-02 – 2022-11-03 (×3): 17 g
  Filled 2022-11-02 (×3): qty 1

## 2022-11-02 MED ORDER — LORAZEPAM 2 MG/ML IJ SOLN
1.0000 mg | INTRAMUSCULAR | Status: DC | PRN
  Administered 2022-11-02 – 2022-11-06 (×7): 1 mg via INTRAVENOUS
  Filled 2022-11-02 (×7): qty 1

## 2022-11-02 MED ORDER — FAMOTIDINE 20 MG PO TABS
10.0000 mg | ORAL_TABLET | Freq: Two times a day (BID) | ORAL | Status: AC
  Administered 2022-11-02 – 2022-11-08 (×14): 10 mg
  Filled 2022-11-02 (×14): qty 1

## 2022-11-02 MED ORDER — TRAZODONE HCL 50 MG PO TABS
100.0000 mg | ORAL_TABLET | Freq: Every day | ORAL | Status: DC
  Administered 2022-11-02 – 2022-11-03 (×2): 100 mg
  Filled 2022-11-02 (×2): qty 2

## 2022-11-02 MED ORDER — GABAPENTIN 250 MG/5ML PO SOLN
300.0000 mg | Freq: Three times a day (TID) | ORAL | Status: DC
  Administered 2022-11-02 – 2022-11-11 (×29): 300 mg
  Filled 2022-11-02 (×32): qty 6

## 2022-11-02 MED ORDER — FUROSEMIDE 10 MG/ML IJ SOLN
40.0000 mg | Freq: Once | INTRAMUSCULAR | Status: AC
  Administered 2022-11-02: 40 mg via INTRAVENOUS
  Filled 2022-11-02: qty 4

## 2022-11-02 NOTE — Progress Notes (Signed)
PT Cancellation Note  Patient Details Name: KEIONDRE COLEE MRN: [id number] DOB: Feb 13, 1966   Cancelled Treatment:    Reason Eval/Treat Not Completed: Patient not medically ready. Pt was re-intubated yesterday and is now sedated and in restraints with inability to participate in PT eval. Acute PT to return as able, as appropriate to complete PT eval.  Lewis Shock, PT, DPT Acute Rehabilitation Services Secure chat preferred Office #: [phone]    Iona Hansen 11/02/2022, 8:16 AM
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
## 2022-11-02 NOTE — Progress Notes (Signed)
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 Right eye requires drops and orders are in.

## 2022-11-02 NOTE — Progress Notes (Signed)
MD made aware of pts poor vascular and how lab is unable to get blood work.

## 2022-11-02 NOTE — Progress Notes (Signed)
eLink Physician-Brief Progress Note Patient Name: Austin Berry DOB: [birth year] MRN: [id number]   Date of Service  11/02/2022  HPI/Events of Note  Request for restraints Patient on vent  eICU Interventions  Wrist restraints ordered     Intervention Category Minor Interventions: Agitation / anxiety - evaluation and management   [name] 11/02/2022, 4:35 AM

## 2022-11-03 ENCOUNTER — Inpatient Hospital Stay (HOSPITAL_COMMUNITY): Payer: Medicaid Other

## 2022-11-03 DIAGNOSIS — T783XXA Angioneurotic edema, initial encounter: Secondary | ICD-10-CM | POA: Diagnosis not present

## 2022-11-03 DIAGNOSIS — J9601 Acute respiratory failure with hypoxia: Secondary | ICD-10-CM | POA: Diagnosis not present

## 2022-11-03 LAB — POCT I-STAT 7, (LYTES, BLD GAS, ICA,H+H)
Acid-base deficit: 8 mmol/L — ABNORMAL HIGH (ref 0.0–2.0)
Acid-base deficit: 9 mmol/L — ABNORMAL HIGH (ref 0.0–2.0)
Bicarbonate: 16.4 mmol/L — ABNORMAL LOW (ref 20.0–28.0)
Bicarbonate: 16.3 mmol/L — ABNORMAL LOW (ref 20.0–28.0)
Calcium, Ion: 1.14 mmol/L — ABNORMAL LOW (ref 1.15–1.40)
Calcium, Ion: 1.15 mmol/L (ref 1.15–1.40)
HCT: 27 % — ABNORMAL LOW (ref 39.0–52.0)
HCT: 30 % — ABNORMAL LOW (ref 39.0–52.0)
Hemoglobin: 9.2 g/dL — ABNORMAL LOW (ref 13.0–17.0)
Hemoglobin: 10.2 g/dL — ABNORMAL LOW (ref 13.0–17.0)
O2 Saturation: 97 %
O2 Saturation: 93 %
Patient temperature: 98.1
Patient temperature: 97.8
Potassium: 3.8 mmol/L (ref 3.5–5.1)
Potassium: 3.7 mmol/L (ref 3.5–5.1)
Sodium: 144 mmol/L (ref 135–145)
Sodium: 145 mmol/L (ref 135–145)
TCO2: 17 mmol/L — ABNORMAL LOW (ref 22–32)
TCO2: 17 mmol/L — ABNORMAL LOW (ref 22–32)
pCO2 arterial: 30.1 mmHg — ABNORMAL LOW (ref 32–48)
pCO2 arterial: 31.7 mmHg — ABNORMAL LOW (ref 32–48)
pH, Arterial: 7.343 — ABNORMAL LOW (ref 7.35–7.45)
pH, Arterial: 7.318 — ABNORMAL LOW (ref 7.35–7.45)
pO2, Arterial: 93 mmHg (ref 83–108)
pO2, Arterial: 69 mmHg — ABNORMAL LOW (ref 83–108)

## 2022-11-03 LAB — CBC
HCT: 29.5 % — ABNORMAL LOW (ref 39.0–52.0)
Hemoglobin: 9.6 g/dL — ABNORMAL LOW (ref 13.0–17.0)
MCH: 34.7 pg — ABNORMAL HIGH (ref 26.0–34.0)
MCHC: 32.5 g/dL (ref 30.0–36.0)
MCV: 106.5 fL — ABNORMAL HIGH (ref 80.0–100.0)
Platelets: 219 10*3/uL (ref 150–400)
RBC: 2.77 MIL/uL — ABNORMAL LOW (ref 4.22–5.81)
RDW: 15.6 % — ABNORMAL HIGH (ref 11.5–15.5)
WBC: 9.9 10*3/uL (ref 4.0–10.5)
nRBC: 1.7 % — ABNORMAL HIGH (ref 0.0–0.2)

## 2022-11-03 LAB — GLUCOSE, CAPILLARY
Glucose-Capillary: 104 mg/dL — ABNORMAL HIGH (ref 70–99)
Glucose-Capillary: 146 mg/dL — ABNORMAL HIGH (ref 70–99)
Glucose-Capillary: 156 mg/dL — ABNORMAL HIGH (ref 70–99)
Glucose-Capillary: 158 mg/dL — ABNORMAL HIGH (ref 70–99)
Glucose-Capillary: 81 mg/dL (ref 70–99)
Glucose-Capillary: 95 mg/dL (ref 70–99)

## 2022-11-03 LAB — BASIC METABOLIC PANEL WITH GFR
Anion gap: 11 (ref 5–15)
BUN: 32 mg/dL — ABNORMAL HIGH (ref 6–20)
CO2: 16 mmol/L — ABNORMAL LOW (ref 22–32)
Calcium: 8.1 mg/dL — ABNORMAL LOW (ref 8.9–10.3)
Chloride: 114 mmol/L — ABNORMAL HIGH (ref 98–111)
Creatinine, Ser: 2.06 mg/dL — ABNORMAL HIGH (ref 0.61–1.24)
GFR, Estimated: 37 mL/min — ABNORMAL LOW (ref >60)
Glucose, Bld: 177 mg/dL — ABNORMAL HIGH (ref 70–99)
Potassium: 4 mmol/L (ref 3.5–5.1)
Sodium: 141 mmol/L (ref 135–145)

## 2022-11-03 LAB — COMPREHENSIVE METABOLIC PANEL
ALT: 38 U/L (ref 0–44)
AST: 50 U/L — ABNORMAL HIGH (ref 15–41)
Albumin: 2.4 g/dL — ABNORMAL LOW (ref 3.5–5.0)
Alkaline Phosphatase: 166 U/L — ABNORMAL HIGH (ref 38–126)
Anion gap: 10 (ref 5–15)
BUN: 32 mg/dL — ABNORMAL HIGH (ref 6–20)
CO2: 17 mmol/L — ABNORMAL LOW (ref 22–32)
Calcium: 8.4 mg/dL — ABNORMAL LOW (ref 8.9–10.3)
Chloride: 115 mmol/L — ABNORMAL HIGH (ref 98–111)
Creatinine, Ser: 1.9 mg/dL — ABNORMAL HIGH (ref 0.61–1.24)
GFR, Estimated: 40 mL/min — ABNORMAL LOW (ref >60)
Glucose, Bld: 180 mg/dL — ABNORMAL HIGH (ref 70–99)
Potassium: 3.7 mmol/L (ref 3.5–5.1)
Sodium: 142 mmol/L (ref 135–145)
Total Bilirubin: 0.3 mg/dL (ref 0.3–1.2)
Total Protein: 5.8 g/dL — ABNORMAL LOW (ref 6.5–8.1)

## 2022-11-03 LAB — PHOSPHORUS: Phosphorus: 5.4 mg/dL — ABNORMAL HIGH (ref 2.5–4.6)

## 2022-11-03 LAB — MAGNESIUM
Magnesium: 2.3 mg/dL (ref 1.7–2.4)
Magnesium: 2.3 mg/dL (ref 1.7–2.4)

## 2022-11-03 MED ORDER — LABETALOL HCL 5 MG/ML IV SOLN: 10.0000 mg | INTRAVENOUS | Status: DC | PRN

## 2022-11-03 MED ORDER — FUROSEMIDE 10 MG/ML IJ SOLN
40.0000 mg | Freq: Two times a day (BID) | INTRAMUSCULAR | Status: AC
  Administered 2022-11-03 (×2): 40 mg via INTRAVENOUS
  Filled 2022-11-03 (×2): qty 4

## 2022-11-03 MED ORDER — AMLODIPINE BESYLATE 10 MG PO TABS
10.0000 mg | ORAL_TABLET | Freq: Every day | ORAL | Status: DC
  Administered 2022-11-03 – 2022-11-13 (×11): 10 mg
  Filled 2022-11-03 (×11): qty 1

## 2022-11-03 MED ORDER — ONDANSETRON HCL 4 MG/2ML IJ SOLN
4.0000 mg | Freq: Once | INTRAMUSCULAR | Status: AC
  Administered 2022-11-03: 4 mg via INTRAVENOUS
  Filled 2022-11-03: qty 2

## 2022-11-03 MED ORDER — QUETIAPINE FUMARATE 50 MG PO TABS
50.0000 mg | ORAL_TABLET | Freq: Two times a day (BID) | ORAL | Status: DC
  Administered 2022-11-03 – 2022-11-04 (×3): 50 mg
  Filled 2022-11-03 (×3): qty 1

## 2022-11-03 MED ORDER — HYDRALAZINE HCL 25 MG PO TABS
25.0000 mg | ORAL_TABLET | Freq: Three times a day (TID) | ORAL | Status: DC
  Administered 2022-11-03 – 2022-11-05 (×5): 25 mg
  Filled 2022-11-03 (×5): qty 1

## 2022-11-03 MED ORDER — POTASSIUM CHLORIDE 20 MEQ PO PACK
20.0000 meq | PACK | Freq: Once | ORAL | Status: AC
  Administered 2022-11-03: 20 meq
  Filled 2022-11-03: qty 1

## 2022-11-03 MED ORDER — SENNA 8.6 MG PO TABS
1.0000 | ORAL_TABLET | Freq: Every day | ORAL | Status: DC
  Administered 2022-11-03: 8.6 mg
  Filled 2022-11-03: qty 1

## 2022-11-03 MED ORDER — METOPROLOL TARTRATE 50 MG PO TABS
50.0000 mg | ORAL_TABLET | Freq: Two times a day (BID) | ORAL | Status: DC
  Administered 2022-11-03 – 2022-11-04 (×2): 50 mg
  Filled 2022-11-03 (×2): qty 1

## 2022-11-03 NOTE — Progress Notes (Signed)
Orange bite block placed due to pt repeatedly biting ETT

## 2022-11-03 NOTE — Progress Notes (Signed)
ABG ordered and obtained on post ventilator changes.  Obtained on settings of VT: 620, RR: 18, FIO2: 40%, and PEEP: 8.   Results given to MD.  No further changes at this time.  Unit RT aware. Will continue to monitor.    Latest Reference Range & Units 11/03/22 08:22  Sample type  ARTERIAL  pH, Arterial 7.35 - 7.45  7.318 (L)  pCO2 arterial 32 - 48 mmHg 31.7 (L)  pO2, Arterial 83 - 108 mmHg 69 (L)  TCO2 22 - 32 mmol/L 17 (L)  Acid-base deficit 0.0 - 2.0 mmol/L 9.0 (H)  Bicarbonate 20.0 - 28.0 mmol/L 16.3 (L)  O2 Saturation % 93  Patient temperature  97.8 F
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
## 2022-11-03 NOTE — Progress Notes (Signed)
Pt had a vomiting episode, MD notified and PRN zofran used. TF paused and OGT placed on low intermit suction

## 2022-11-03 NOTE — Progress Notes (Addendum)
Patient with persistent agitation and potential for self harm while weaning sedation. Started scheduled seroquel to help reduce propofol/fentanyl. He does respond well to prn ativan. Failed SBT due to apnea but was still on a good amount of sedation  Also was unable to advance tube feeds due to nausea. Those are on hold for now. Added zofran.   Hypertensive, added back some home meds.   Additional cc time 40 minutes.   Durel Salts, MD Pulmonary and Critical Care Medicine [facility] 11/03/2022 4:24 PM Pager: see AMION  If no response to pager, please call critical care on call (see AMION) until 7pm After 7:00 pm call Elink

## 2022-11-04 ENCOUNTER — Other Ambulatory Visit: Payer: Self-pay

## 2022-11-04 ENCOUNTER — Ambulatory Visit: Payer: Medicaid Other | Admitting: Podiatry

## 2022-11-04 DIAGNOSIS — Z9911 Dependence on respirator [ventilator] status: Secondary | ICD-10-CM

## 2022-11-04 DIAGNOSIS — J690 Pneumonitis due to inhalation of food and vomit: Secondary | ICD-10-CM

## 2022-11-04 DIAGNOSIS — G9341 Metabolic encephalopathy: Secondary | ICD-10-CM

## 2022-11-04 LAB — GLUCOSE, CAPILLARY
Glucose-Capillary: 73 mg/dL (ref 70–99)
Glucose-Capillary: 85 mg/dL (ref 70–99)
Glucose-Capillary: 89 mg/dL (ref 70–99)
Glucose-Capillary: 137 mg/dL — ABNORMAL HIGH (ref 70–99)
Glucose-Capillary: 107 mg/dL — ABNORMAL HIGH (ref 70–99)
Glucose-Capillary: 133 mg/dL — ABNORMAL HIGH (ref 70–99)
Glucose-Capillary: 121 mg/dL — ABNORMAL HIGH (ref 70–99)

## 2022-11-04 LAB — CBC
HCT: 28.4 % — ABNORMAL LOW (ref 39.0–52.0)
Hemoglobin: 9.3 g/dL — ABNORMAL LOW (ref 13.0–17.0)
MCH: 34.4 pg — ABNORMAL HIGH (ref 26.0–34.0)
MCHC: 32.7 g/dL (ref 30.0–36.0)
MCV: 105.2 fL — ABNORMAL HIGH (ref 80.0–100.0)
Platelets: 220 10*3/uL (ref 150–400)
RBC: 2.7 MIL/uL — ABNORMAL LOW (ref 4.22–5.81)
RDW: 17 % — ABNORMAL HIGH (ref 11.5–15.5)
WBC: 12.8 10*3/uL — ABNORMAL HIGH (ref 4.0–10.5)
nRBC: 0.5 % — ABNORMAL HIGH (ref 0.0–0.2)

## 2022-11-04 LAB — BASIC METABOLIC PANEL
Anion gap: 17 — ABNORMAL HIGH (ref 5–15)
BUN: 32 mg/dL — ABNORMAL HIGH (ref 6–20)
CO2: 18 mmol/L — ABNORMAL LOW (ref 22–32)
Calcium: 9.2 mg/dL (ref 8.9–10.3)
Chloride: 110 mmol/L (ref 98–111)
Creatinine, Ser: 1.87 mg/dL — ABNORMAL HIGH (ref 0.61–1.24)
GFR, Estimated: 41 mL/min — ABNORMAL LOW (ref >60)
Glucose, Bld: 132 mg/dL — ABNORMAL HIGH (ref 70–99)
Potassium: 3.8 mmol/L (ref 3.5–5.1)
Sodium: 145 mmol/L (ref 135–145)

## 2022-11-04 LAB — CULTURE, RESPIRATORY W GRAM STAIN

## 2022-11-04 LAB — MAGNESIUM: Magnesium: 2.1 mg/dL (ref 1.7–2.4)

## 2022-11-04 MED ORDER — SODIUM CHLORIDE 0.9% FLUSH
10.0000 mL | Freq: Two times a day (BID) | INTRAVENOUS | Status: DC
  Administered 2022-11-04 – 2022-11-07 (×7): 10 mL
  Administered 2022-11-07: 30 mL
  Administered 2022-11-08 – 2022-11-12 (×9): 10 mL

## 2022-11-04 MED ORDER — FUROSEMIDE 10 MG/ML IJ SOLN
40.0000 mg | Freq: Three times a day (TID) | INTRAMUSCULAR | Status: AC
  Administered 2022-11-04 (×2): 40 mg via INTRAVENOUS
  Filled 2022-11-04 (×2): qty 4

## 2022-11-04 MED ORDER — CLEVIDIPINE BUTYRATE 0.5 MG/ML IV EMUL
0.0000 mg/h | INTRAVENOUS | Status: DC
  Administered 2022-11-04: 2 mg/h via INTRAVENOUS
  Filled 2022-11-04: qty 100

## 2022-11-04 MED ORDER — POLYETHYLENE GLYCOL 3350 17 G PO PACK: 17.0000 g | PACK | Freq: Every day | ORAL | Status: DC

## 2022-11-04 MED ORDER — DEXMEDETOMIDINE HCL IN NACL 400 MCG/100ML IV SOLN
0.0000 ug/kg/h | INTRAVENOUS | Status: DC
  Administered 2022-11-04: 1.2 ug/kg/h via INTRAVENOUS
  Administered 2022-11-04: 0.4 ug/kg/h via INTRAVENOUS
  Administered 2022-11-04: 0.8 ug/kg/h via INTRAVENOUS
  Administered 2022-11-05: 1 ug/kg/h via INTRAVENOUS
  Administered 2022-11-05: 1.2 ug/kg/h via INTRAVENOUS
  Administered 2022-11-05 (×3): 1 ug/kg/h via INTRAVENOUS
  Administered 2022-11-06: 0.8 ug/kg/h via INTRAVENOUS
  Administered 2022-11-06: 1 ug/kg/h via INTRAVENOUS
  Administered 2022-11-06: 1.2 ug/kg/h via INTRAVENOUS
  Administered 2022-11-06 (×2): 1 ug/kg/h via INTRAVENOUS
  Administered 2022-11-07: 0.8 ug/kg/h via INTRAVENOUS
  Administered 2022-11-07: 1 ug/kg/h via INTRAVENOUS
  Filled 2022-11-04 (×14): qty 100

## 2022-11-04 MED ORDER — QUETIAPINE FUMARATE 100 MG PO TABS
100.0000 mg | ORAL_TABLET | Freq: Two times a day (BID) | ORAL | Status: DC
  Administered 2022-11-04 – 2022-11-06 (×5): 100 mg
  Filled 2022-11-04 (×5): qty 1

## 2022-11-04 MED ORDER — SODIUM CHLORIDE 0.9% FLUSH: 10.0000 mL | INTRAVENOUS | Status: DC | PRN

## 2022-11-04 MED ORDER — QUETIAPINE FUMARATE 50 MG PO TABS
50.0000 mg | ORAL_TABLET | Freq: Once | ORAL | Status: AC
  Administered 2022-11-04: 50 mg
  Filled 2022-11-04: qty 1

## 2022-11-04 NOTE — Progress Notes (Signed)
Peripherally Inserted Central Catheter Placement  The IV Nurse has discussed with the patient and/or persons authorized to consent for the patient, the purpose of this procedure and the potential benefits and risks involved with this procedure.  The benefits include less needle sticks, lab draws from the catheter, and the patient may be discharged home with the catheter. Risks include, but not limited to, infection, bleeding, blood clot (thrombus formation), and puncture of an artery; nerve damage and irregular heartbeat and possibility to perform a PICC exchange if needed/ordered by physician.  Alternatives to this procedure were also discussed.  Bard Power PICC patient education guide, fact sheet on infection prevention and patient information card has been provided to patient /or left at bedside.    PICC Placement Documentation  PICC Triple Lumen 11/04/22 Right Basilic 40 cm 1 cm (Active)  Indication for Insertion or Continuance of Line Limited venous access - need for IV therapy >5 days (PICC only) 11/04/22 1210  Exposed Catheter (cm) 1 cm 11/04/22 1210  Site Assessment Clean, Dry, Intact 11/04/22 1210  Lumen #1 Status Flushed;Blood return noted;Saline locked 11/04/22 1210  Lumen #2 Status Flushed;Blood return noted;Saline locked 11/04/22 1210  Lumen #3 Status Flushed;Blood return noted;Saline locked 11/04/22 1210  Dressing Type Transparent 11/04/22 1210  Dressing Status Antimicrobial disc in place 11/04/22 1210  Line Care Connections checked and tightened 11/04/22 1210  Line Adjustment (NICU/IV Team Only) No 11/04/22 1210  Dressing Intervention New dressing 11/04/22 1210  Dressing Change Due 11/11/22 11/04/22 1210       Audrie Gallus 11/04/2022, 12:20 PM

## 2022-11-04 NOTE — Progress Notes (Signed)
Nutrition Follow-up  DOCUMENTATION CODES:   Not applicable  INTERVENTION:   Continue tube feeding via OG tube: - Increase rate of Vital AF 1.2 back to goal rate of 55 ml/hr (1320 ml/day) - PROSource TF20 60 ml daily  Tube feeding regimen provides 1664 kcal, 119 grams of protein, and 1071 ml of H2O.   Tube feeding regimen and current propofol provides 2098 total kcal (100% of needs).  NUTRITION DIAGNOSIS:   Inadequate oral intake related to inability to eat as evidenced by NPO status.  Ongoing, being addressed via TF  GOAL:   Patient will meet greater than or equal to 90% of their needs  Met via TF at goal rate  MONITOR:   Vent status, TF tolerance, Labs, Weight trends, I & O's  REASON FOR ASSESSMENT:   Consult Enteral/tube feeding initiation and management  ASSESSMENT:   Pt with hx of HTN, DM, EtOH abuse, and blindness presented to [facility] ED with possible allergic reaction and tongue swelling. Intubated in ED and transferred to [facility] for management  7/30 - presented to ED, intubated 7/31 - transferred to [facility] 8/02 - extubated, later reintubated 8/04 - emesis x 1, TF held, OG tube to [facility] 8/05 - TF restarted  RD working remotely. Noted tube feeds held yesterday evening after episode of vomiting. Tube feeds resumed this morning at reduced rate and are slowly being advanced back to goal rate.  Per nursing edema assessment, pt with mild pitting generalized edema, moderate pitting edema to RUE, and mild pitting edema to LUE and RLE. Pt is net positive 8.2 L this admission.  Admit weight: 86.2 kg Current weight: 90.4 kg  Current TF: Vital AF 1.2 @ 40 ml/hr (goal rate 55 ml/hr), PROSource TF20 60 ml daily  Patient is currently intubated on ventilator support Temp (24hrs), Avg:98.4 F (36.9 C), Min:98.1 F (36.7 C), Max:98.9 F (37.2 C)  Drips: Propofol: 30 mcg/kg/min (provides 434 kcal daily from lipid) Precedex Fentanyl  Medications reviewed and include:  pepcid, folic acid, IV lasix 40 mEq x 2, SSI every 4 hours, melatonin, phenobarbital taper, miralax, thiamine, IV abx  Labs reviewed: BUN 32, creatinine 1.87, WBC 12.8, hemoglobin 9.3 CBG's: 73-137 x 24 hours  UOP: 3400 ml x 24 hours NGT: 250 ml x 12 hours I/O's: +8.2 L since admit  Diet Order:   Diet Order             Diet NPO time specified  Diet effective now                   EDUCATION NEEDS:   Not appropriate for education at this time  Skin:  Skin Assessment: Reviewed RN Assessment  Last BM:  11/04/22  Height:   Ht Readings from Last 1 Encounters:  10/30/22 6' 0.01" (1.829 m)    Weight:   Wt Readings from Last 1 Encounters:  11/04/22 90.4 kg    Ideal Body Weight:  80.9 kg  BMI:  Body mass index is 27.02 kg/m.  Estimated Nutritional Needs:   Kcal:  2100-2300 kcal/d  Protein:  105-120g/d  Fluid:  2.2L/d    Mertie Clause, MS, RD, LDN Registered Dietitian II Please see AMiON for contact information.
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
## 2022-11-04 NOTE — Plan of Care (Signed)

## 2022-11-04 NOTE — TOC Progression Note (Signed)
Transition of Care [facility]) - Progression Note    Patient Details  Name: ARVILL LIBENGOOD MRN: [id number] Date of Birth: 13-Jan-1966  Transition of Care [facility] CM/SW Contact  Tom-Johnson, Hershal Coria, RN Phone Number: 11/04/2022, 3:36 PM  Clinical Narrative:     Patient continues to be intubated and sedated. On IV abx, gentle diuretics. PICC placed today 11/04/23 for Lab draws. Prosthetic eye removed d/t infection concern, placed in a cup at bedside.  Patient no Medically ready for discharge.    CM will continue to follow as patient progresses with care towards discharge.      Expected Discharge Plan and Services                                               Social Determinants of Health (SDOH) Interventions SDOH Screenings   Tobacco Use: High Risk (10/29/2022)    Readmission Risk Interventions    10/30/2022    1:17 PM  Readmission Risk Prevention Plan  Transportation Screening Complete  PCP or Specialist Appt within 3-5 Days Complete  HRI or Home Care Consult Complete  Social Work Consult for Recovery Care Planning/Counseling Complete  Palliative Care Screening Not Applicable  Medication Review Oceanographer) Referral to Pharmacy

## 2022-11-04 NOTE — Progress Notes (Signed)
PT Cancellation Note  Patient Details Name: Austin Berry MRN: [id number] DOB: 11-Nov-1965   Cancelled Treatment:    Reason Eval/Treat Not Completed: Patient not medically ready (pt remains intubated after PT order and agitated with all stimulation not currently appropriate and discussed with RN. will sign off and await new order)    B  11/04/2022, 8:04 AM Merryl Hacker, PT Acute Rehabilitation Services Office: [phone]

## 2022-11-05 ENCOUNTER — Inpatient Hospital Stay (HOSPITAL_COMMUNITY): Payer: Medicaid Other

## 2022-11-05 DIAGNOSIS — Z9911 Dependence on respirator [ventilator] status: Secondary | ICD-10-CM | POA: Diagnosis not present

## 2022-11-05 DIAGNOSIS — J189 Pneumonia, unspecified organism: Secondary | ICD-10-CM | POA: Diagnosis not present

## 2022-11-05 DIAGNOSIS — J9601 Acute respiratory failure with hypoxia: Secondary | ICD-10-CM | POA: Diagnosis not present

## 2022-11-05 LAB — COMPREHENSIVE METABOLIC PANEL
ALT: 43 U/L (ref 0–44)
AST: 43 U/L — ABNORMAL HIGH (ref 15–41)
Albumin: 2.7 g/dL — ABNORMAL LOW (ref 3.5–5.0)
Alkaline Phosphatase: 197 U/L — ABNORMAL HIGH (ref 38–126)
Anion gap: 16 — ABNORMAL HIGH (ref 5–15)
BUN: 33 mg/dL — ABNORMAL HIGH (ref 6–20)
CO2: 21 mmol/L — ABNORMAL LOW (ref 22–32)
Calcium: 9.8 mg/dL (ref 8.9–10.3)
Chloride: 106 mmol/L (ref 98–111)
Creatinine, Ser: 1.81 mg/dL — ABNORMAL HIGH (ref 0.61–1.24)
GFR, Estimated: 43 mL/min — ABNORMAL LOW (ref >60)
Glucose, Bld: 161 mg/dL — ABNORMAL HIGH (ref 70–99)
Potassium: 3 mmol/L — ABNORMAL LOW (ref 3.5–5.1)
Sodium: 143 mmol/L (ref 135–145)
Total Bilirubin: 0.5 mg/dL (ref 0.3–1.2)
Total Protein: 6.9 g/dL (ref 6.5–8.1)

## 2022-11-05 LAB — BLOOD GAS, ARTERIAL
Acid-Base Excess: 0.5 mmol/L (ref 0.0–2.0)
Bicarbonate: 24.6 mmol/L (ref 20.0–28.0)
O2 Saturation: 100 %
Patient temperature: 36.4
pCO2 arterial: 36 mmHg (ref 32–48)
pH, Arterial: 7.44 (ref 7.35–7.45)
pO2, Arterial: 88 mmHg (ref 83–108)

## 2022-11-05 LAB — CBC
HCT: 31 % — ABNORMAL LOW (ref 39.0–52.0)
Hemoglobin: 10.2 g/dL — ABNORMAL LOW (ref 13.0–17.0)
MCH: 34.2 pg — ABNORMAL HIGH (ref 26.0–34.0)
MCHC: 32.9 g/dL (ref 30.0–36.0)
MCV: 104 fL — ABNORMAL HIGH (ref 80.0–100.0)
Platelets: 220 10*3/uL (ref 150–400)
RBC: 2.98 MIL/uL — ABNORMAL LOW (ref 4.22–5.81)
RDW: 15.9 % — ABNORMAL HIGH (ref 11.5–15.5)
WBC: 10.5 10*3/uL (ref 4.0–10.5)
nRBC: 0.2 % (ref 0.0–0.2)

## 2022-11-05 LAB — PHOSPHORUS: Phosphorus: 6 mg/dL — ABNORMAL HIGH (ref 2.5–4.6)

## 2022-11-05 LAB — GLUCOSE, CAPILLARY
Glucose-Capillary: 128 mg/dL — ABNORMAL HIGH (ref 70–99)
Glucose-Capillary: 199 mg/dL — ABNORMAL HIGH (ref 70–99)
Glucose-Capillary: 210 mg/dL — ABNORMAL HIGH (ref 70–99)
Glucose-Capillary: 142 mg/dL — ABNORMAL HIGH (ref 70–99)
Glucose-Capillary: 141 mg/dL — ABNORMAL HIGH (ref 70–99)
Glucose-Capillary: 189 mg/dL — ABNORMAL HIGH (ref 70–99)

## 2022-11-05 LAB — MRSA NEXT GEN BY PCR, NASAL: MRSA by PCR Next Gen: DETECTED — AB

## 2022-11-05 LAB — MAGNESIUM: Magnesium: 1.9 mg/dL (ref 1.7–2.4)

## 2022-11-05 MED ORDER — FUROSEMIDE 10 MG/ML IJ SOLN
80.0000 mg | Freq: Once | INTRAMUSCULAR | Status: AC
  Administered 2022-11-05: 80 mg via INTRAVENOUS
  Filled 2022-11-05: qty 8

## 2022-11-05 MED ORDER — SODIUM CHLORIDE 0.9 % IV SOLN
2.0000 g | Freq: Two times a day (BID) | INTRAVENOUS | Status: DC
  Administered 2022-11-05 – 2022-11-09 (×8): 2 g via INTRAVENOUS
  Filled 2022-11-05 (×9): qty 12.5

## 2022-11-05 MED ORDER — POTASSIUM CHLORIDE 20 MEQ PO PACK
20.0000 meq | PACK | ORAL | Status: AC
  Administered 2022-11-05 (×2): 20 meq
  Filled 2022-11-05 (×2): qty 1

## 2022-11-05 MED ORDER — HYDRALAZINE HCL 25 MG PO TABS
25.0000 mg | ORAL_TABLET | Freq: Once | ORAL | Status: AC
  Administered 2022-11-05: 25 mg
  Filled 2022-11-05: qty 1

## 2022-11-05 MED ORDER — VANCOMYCIN HCL 1750 MG/350ML IV SOLN
1750.0000 mg | Freq: Once | INTRAVENOUS | Status: AC
  Administered 2022-11-05: 1750 mg via INTRAVENOUS
  Filled 2022-11-05: qty 350

## 2022-11-05 MED ORDER — POTASSIUM CHLORIDE 10 MEQ/50ML IV SOLN
10.0000 meq | INTRAVENOUS | Status: AC
  Administered 2022-11-05 (×4): 10 meq via INTRAVENOUS
  Filled 2022-11-05: qty 50

## 2022-11-05 MED ORDER — VANCOMYCIN HCL 1250 MG/250ML IV SOLN
1250.0000 mg | INTRAVENOUS | Status: DC
  Administered 2022-11-06: 1250 mg via INTRAVENOUS
  Filled 2022-11-05 (×2): qty 250

## 2022-11-05 MED ORDER — HYDRALAZINE HCL 50 MG PO TABS
50.0000 mg | ORAL_TABLET | Freq: Three times a day (TID) | ORAL | Status: DC
  Administered 2022-11-05 – 2022-11-12 (×22): 50 mg
  Filled 2022-11-05 (×25): qty 1

## 2022-11-05 NOTE — Progress Notes (Signed)
Pharmacy Antibiotic Note  Austin Berry is a 57 y.o. male admitted on 10/29/2022 with pneumonia and bacteremia.  Pharmacy has been consulted for cefepime and vancomycin dosing.  WBC 12.5>10.5, Tm 97.5 CXR 8/6> Mild residual hazy right/left basilar opacities Increased oxygen requirements (FiO2 increased 40>100 last 24 hours)  Plan: Continue cefepime 2 g IV every 12 hours Start vancomycin 1750 mg IV x1, followed by vancomycin 1250 mg q24 (Scr 1.81, predicted AUC 444) Obtain levels prn per protocol Monitor CBC, renal function, and signs of clinical improvement  Height: 6' 0.01" (182.9 cm) Weight: 82.4 kg (181 lb 10.5 oz) IBW/kg (Calculated) : 77.62  Temp (24hrs), Avg:97.7 F (36.5 C), Min:97.5 F (36.4 C), Max:98.2 F (36.8 C)  Recent Labs  Lab 10/31/22 0417 11/02/22 2049 11/03/22 0201 11/03/22 0826 11/04/22 0741 11/04/22 1028 11/05/22 0338  WBC 9.1 9.6 9.9  --  12.8*  --  10.5  CREATININE 2.06* 2.05* 2.06* 1.90*  --  1.87* 1.81*    Estimated Creatinine Clearance: 48.8 mL/min (A) (by C-G formula based on SCr of 1.81 mg/dL (H)).    Allergies  Allergen Reactions   Ace Inhibitors Swelling   Tramadol Itching    Antimicrobials this admission: 7/31 azithro x1 dose 7/31 CTX x 1 dose, 8/1 x1 dose, 8/2>>8/6 8/1 cefepime> 8/2, 8/6>> 8/6 vancomycin>>  Microbiology results: 8/5 Resp cx: GPCs, GNR, WBC--Pending 8/1 UA: rare bacteria 7/30 MRSA PCR: Neg  8/6 MRSA PCR: Pos  7/30 Bcx: 1/2> Pantoea agglomerans   Thank you for allowing pharmacy to be a part of this patient's care.  Stephenie Acres, PharmD PGY1 Pharmacy Resident 11/05/2022 2:06 PM

## 2022-11-05 NOTE — Progress Notes (Signed)
Attempted to call daughter to provide update. Mailbox is full, no message left.  Please let us know if family arrives to bedside and will update.

## 2022-11-05 NOTE — Progress Notes (Signed)
[facility] ADULT ICU REPLACEMENT PROTOCOL   The patient does apply for the [facility] Adult ICU Electrolyte Replacment Protocol based on the criteria listed below:   1.Exclusion criteria: TCTS, ECMO, Dialysis, and Myasthenia Gravis patients 2. Is GFR >/= 30 ml/min? Yes.    Patient's GFR today is 43 3. Is SCr </= 2? Yes.   Patient's SCr is 1.81 mg/dL 4. Did SCr increase >/= 0.5 in 24 hours? No. 5.Pt's weight >40kg  Yes.   6. Abnormal electrolyte(s): k 3.0  7. Electrolytes replaced per protocol 8.  Call MD STAT for K+ </= 2.5, Phos </= 1, or Mag </= 1 Physician:    Markus Daft A 11/05/2022 5:26 AM

## 2022-11-05 NOTE — Plan of Care (Signed)
  Problem: Nutrition: Goal: Adequate nutrition will be maintained Outcome: Progressing   Problem: Elimination: Goal: Will not experience complications related to bowel motility Outcome: Progressing Goal: Will not experience complications related to urinary retention Outcome: Progressing   Problem: Skin Integrity: Goal: Risk for impaired skin integrity will decrease Outcome: Progressing   Problem: Activity: Goal: Risk for activity intolerance will decrease Outcome: Not Progressing   Problem: Coping: Goal: Level of anxiety will decrease Outcome: Not Progressing
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
## 2022-11-05 NOTE — Progress Notes (Addendum)
Pharmacy Antibiotic Note  Austin Berry is a 57 y.o. male admitted on 10/29/2022 with pneumonia and bacteremia.  Pharmacy has been consulted for cefepime dosing.  WBC 12.5>10.5, Tm 97.5 CXR 8/6> Mild residual hazy right/left basilar opacities Increased oxygen requirements (FiO2 increased 40>100 last 24 hours)  Plan: Stop ceftriaxone Start cefepime 2g IV every 12 hours Monitor CBC, renal function, and signs of clinical improvement  Height: 6' 0.01" (182.9 cm) Weight: 82.4 kg (181 lb 10.5 oz) IBW/kg (Calculated) : 77.62  Temp (24hrs), Avg:97.7 F (36.5 C), Min:97.5 F (36.4 C), Max:98.2 F (36.8 C)  Recent Labs  Lab 10/31/22 0417 11/02/22 2049 11/03/22 0201 11/03/22 0826 11/04/22 0741 11/04/22 1028 11/05/22 0338  WBC 9.1 9.6 9.9  --  12.8*  --  10.5  CREATININE 2.06* 2.05* 2.06* 1.90*  --  1.87* 1.81*    Estimated Creatinine Clearance: 48.8 mL/min (A) (by C-G formula based on SCr of 1.81 mg/dL (H)).    Allergies  Allergen Reactions   Ace Inhibitors Swelling   Tramadol Itching    Antimicrobials this admission: 7/31 azithro x1 dose 7/31 CTX x 1 dose, 8/1 x1 dose, 8/2>>8/6 8/1 cefepime> 8/2, 8/6>>  Microbiology results: 8/5 Resp cx: GPCs, GNR, WBC--Pending 8/1 UA: rare bacteria 7/30 MRSA PCR: Neg  7/30 Bcx: 1/2> Pantoea agglomerans   Thank you for allowing pharmacy to be a part of this patient's care.  Stephenie Acres, PharmD PGY1 Pharmacy Resident 11/05/2022 9:33 AM

## 2022-11-05 NOTE — Progress Notes (Signed)
eLink Physician-Brief Progress Note Patient Name: Austin Berry DOB: Jan 07, 1966 MRN: [id number]   Date of Service  11/05/2022  HPI/Events of Note  Camera evaluation done for Bilateral, non violent soft wrist restriants renewal ordered to prevent self injury and harm from pullineg lines/tubes.  On Ventilator.    eICU Interventions       Intervention Category Minor Interventions: Agitation / anxiety - evaluation and management  Ranee Gosselin 11/05/2022, 1:03 AM

## 2022-11-06 DIAGNOSIS — Z9911 Dependence on respirator [ventilator] status: Secondary | ICD-10-CM | POA: Diagnosis not present

## 2022-11-06 DIAGNOSIS — J189 Pneumonia, unspecified organism: Secondary | ICD-10-CM | POA: Diagnosis not present

## 2022-11-06 DIAGNOSIS — J9601 Acute respiratory failure with hypoxia: Secondary | ICD-10-CM | POA: Diagnosis not present

## 2022-11-06 LAB — GLUCOSE, CAPILLARY
Glucose-Capillary: 178 mg/dL — ABNORMAL HIGH (ref 70–99)
Glucose-Capillary: 147 mg/dL — ABNORMAL HIGH (ref 70–99)
Glucose-Capillary: 148 mg/dL — ABNORMAL HIGH (ref 70–99)
Glucose-Capillary: 246 mg/dL — ABNORMAL HIGH (ref 70–99)
Glucose-Capillary: 171 mg/dL — ABNORMAL HIGH (ref 70–99)
Glucose-Capillary: 174 mg/dL — ABNORMAL HIGH (ref 70–99)

## 2022-11-06 MED ORDER — POTASSIUM CHLORIDE 10 MEQ/50ML IV SOLN
10.0000 meq | INTRAVENOUS | Status: AC
  Administered 2022-11-06 (×4): 10 meq via INTRAVENOUS
  Filled 2022-11-06: qty 50

## 2022-11-06 MED ORDER — ERYTHROMYCIN 5 MG/GM OP OINT
TOPICAL_OINTMENT | Freq: Four times a day (QID) | OPHTHALMIC | Status: AC
  Administered 2022-11-06: 1 via OPHTHALMIC
  Filled 2022-11-06: qty 3.5

## 2022-11-06 MED ORDER — POTASSIUM CHLORIDE 20 MEQ PO PACK
20.0000 meq | PACK | Freq: Once | ORAL | Status: AC
  Administered 2022-11-06: 20 meq
  Filled 2022-11-06: qty 1

## 2022-11-06 MED ORDER — FUROSEMIDE 10 MG/ML IJ SOLN
40.0000 mg | Freq: Three times a day (TID) | INTRAMUSCULAR | Status: AC
  Administered 2022-11-06 (×2): 40 mg via INTRAVENOUS
  Filled 2022-11-06 (×2): qty 4

## 2022-11-06 NOTE — Plan of Care (Signed)
  Problem: Clinical Measurements: Goal: Ability to maintain clinical measurements within normal limits will improve Outcome: Progressing   Problem: Clinical Measurements: Goal: Will remain free from infection Outcome: Progressing   Problem: Clinical Measurements: Goal: Diagnostic test results will improve Outcome: Progressing   Problem: Clinical Measurements: Goal: Respiratory complications will improve Outcome: Progressing   Problem: Clinical Measurements: Goal: Cardiovascular complication will be avoided Outcome: Progressing   Problem: Nutrition: Goal: Adequate nutrition will be maintained Outcome: Progressing   Problem: Elimination: Goal: Will not experience complications related to bowel motility Outcome: Progressing   Problem: Elimination: Goal: Will not experience complications related to urinary retention Outcome: Progressing   Problem: Pain Managment: Goal: General experience of comfort will improve Outcome: Progressing   Problem: Safety: Goal: Ability to remain free from injury will improve Outcome: Progressing   Problem: Skin Integrity: Goal: Risk for impaired skin integrity will decrease Outcome: Progressing   Problem: Fluid Volume: Goal: Ability to maintain a balanced intake and output will improve Outcome: Progressing   Problem: Metabolic: Goal: Ability to maintain appropriate glucose levels will improve Outcome: Progressing   Problem: Nutritional: Goal: Maintenance of adequate nutrition will improve Outcome: Progressing   Problem: Nutritional: Goal: Progress toward achieving an optimal weight will improve Outcome: Progressing   Problem: Skin Integrity: Goal: Risk for impaired skin integrity will decrease Outcome: Progressing   Problem: Tissue Perfusion: Goal: Adequacy of tissue perfusion will improve Outcome: Progressing   Problem: Safety: Goal: Non-violent Restraint(s) Outcome: Progressing
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
## 2022-11-06 NOTE — Progress Notes (Addendum)
eLink Physician-Brief Progress Note Patient Name: Austin Berry DOB: 10-10-1965 MRN: [id number]   Date of Service  11/06/2022  HPI/Events of Note  Ventilated, sedated, ongoing intermittent agitation.  eICU Interventions  Renew bilateral wrist restraints for patient's safety   0129 -patient's update socket appears to be drying out, will add on her from ice and I ointment.  As needed eyedrops are already in place for intact eye.  Intervention Category Minor Interventions: Agitation / anxiety - evaluation and management    11/06/2022, 12:51 AM

## 2022-11-07 ENCOUNTER — Inpatient Hospital Stay (HOSPITAL_COMMUNITY): Payer: Medicaid Other

## 2022-11-07 DIAGNOSIS — T783XXD Angioneurotic edema, subsequent encounter: Secondary | ICD-10-CM | POA: Diagnosis not present

## 2022-11-07 LAB — GLUCOSE, CAPILLARY
Glucose-Capillary: 201 mg/dL — ABNORMAL HIGH (ref 70–99)
Glucose-Capillary: 158 mg/dL — ABNORMAL HIGH (ref 70–99)
Glucose-Capillary: 189 mg/dL — ABNORMAL HIGH (ref 70–99)
Glucose-Capillary: 178 mg/dL — ABNORMAL HIGH (ref 70–99)
Glucose-Capillary: 156 mg/dL — ABNORMAL HIGH (ref 70–99)
Glucose-Capillary: 163 mg/dL — ABNORMAL HIGH (ref 70–99)

## 2022-11-07 LAB — BASIC METABOLIC PANEL
Anion gap: 14 (ref 5–15)
BUN: 41 mg/dL — ABNORMAL HIGH (ref 6–20)
CO2: 19 mmol/L — ABNORMAL LOW (ref 22–32)
Calcium: 9 mg/dL (ref 8.9–10.3)
Chloride: 108 mmol/L (ref 98–111)
Creatinine, Ser: 1.78 mg/dL — ABNORMAL HIGH (ref 0.61–1.24)
GFR, Estimated: 44 mL/min — ABNORMAL LOW (ref >60)
Glucose, Bld: 173 mg/dL — ABNORMAL HIGH (ref 70–99)
Potassium: 3.8 mmol/L (ref 3.5–5.1)
Sodium: 141 mmol/L (ref 135–145)

## 2022-11-07 LAB — POCT I-STAT 7, (LYTES, BLD GAS, ICA,H+H)
Acid-base deficit: 6 mmol/L — ABNORMAL HIGH (ref 0.0–2.0)
Bicarbonate: 19.9 mmol/L — ABNORMAL LOW (ref 20.0–28.0)
Calcium, Ion: 1.24 mmol/L (ref 1.15–1.40)
HCT: 30 % — ABNORMAL LOW (ref 39.0–52.0)
Hemoglobin: 10.2 g/dL — ABNORMAL LOW (ref 13.0–17.0)
O2 Saturation: 94 %
Patient temperature: 102
Potassium: 4.7 mmol/L (ref 3.5–5.1)
Sodium: 142 mmol/L (ref 135–145)
TCO2: 21 mmol/L — ABNORMAL LOW (ref 22–32)
pCO2 arterial: 44 mmHg (ref 32–48)
pH, Arterial: 7.272 — ABNORMAL LOW (ref 7.35–7.45)
pO2, Arterial: 86 mmHg (ref 83–108)

## 2022-11-07 LAB — MAGNESIUM: Magnesium: 2.1 mg/dL (ref 1.7–2.4)

## 2022-11-07 LAB — CBC
HCT: 28.3 % — ABNORMAL LOW (ref 39.0–52.0)
Hemoglobin: 9.2 g/dL — ABNORMAL LOW (ref 13.0–17.0)
MCH: 33.5 pg (ref 26.0–34.0)
MCHC: 32.5 g/dL (ref 30.0–36.0)
MCV: 102.9 fL — ABNORMAL HIGH (ref 80.0–100.0)
Platelets: 177 10*3/uL (ref 150–400)
RBC: 2.75 MIL/uL — ABNORMAL LOW (ref 4.22–5.81)
RDW: 15.9 % — ABNORMAL HIGH (ref 11.5–15.5)
WBC: 9.1 10*3/uL (ref 4.0–10.5)
nRBC: 0 % (ref 0.0–0.2)

## 2022-11-07 LAB — TROPONIN I (HIGH SENSITIVITY)
Troponin I (High Sensitivity): 14 ng/L (ref <18)
Troponin I (High Sensitivity): 13 ng/L (ref <18)

## 2022-11-07 LAB — PHOSPHORUS: Phosphorus: 5.5 mg/dL — ABNORMAL HIGH (ref 2.5–4.6)

## 2022-11-07 MED ORDER — POTASSIUM CHLORIDE 20 MEQ PO PACK
40.0000 meq | PACK | Freq: Once | ORAL | Status: AC
  Administered 2022-11-07: 40 meq
  Filled 2022-11-07: qty 2

## 2022-11-07 MED ORDER — OXYCODONE HCL 5 MG PO TABS
5.0000 mg | ORAL_TABLET | Freq: Four times a day (QID) | ORAL | Status: DC
  Administered 2022-11-07 – 2022-11-08 (×3): 5 mg
  Filled 2022-11-07 (×3): qty 1

## 2022-11-07 MED ORDER — CLONAZEPAM 0.5 MG PO TABS
0.5000 mg | ORAL_TABLET | Freq: Two times a day (BID) | ORAL | Status: DC
  Administered 2022-11-07: 0.5 mg
  Filled 2022-11-07: qty 1

## 2022-11-07 MED ORDER — CLONAZEPAM 1 MG PO TABS
1.0000 mg | ORAL_TABLET | Freq: Two times a day (BID) | ORAL | Status: DC
  Administered 2022-11-07 – 2022-11-08 (×2): 1 mg
  Filled 2022-11-07 (×2): qty 1

## 2022-11-07 MED ORDER — QUETIAPINE FUMARATE 100 MG PO TABS
200.0000 mg | ORAL_TABLET | Freq: Two times a day (BID) | ORAL | Status: DC
  Administered 2022-11-07 – 2022-11-09 (×6): 200 mg
  Filled 2022-11-07 (×6): qty 2

## 2022-11-07 MED ORDER — OXYCODONE HCL 5 MG PO TABS: 5.0000 mg | ORAL_TABLET | Freq: Four times a day (QID) | ORAL | Status: DC

## 2022-11-07 MED ORDER — FUROSEMIDE 10 MG/ML IJ SOLN
40.0000 mg | Freq: Two times a day (BID) | INTRAMUSCULAR | Status: AC
  Administered 2022-11-07 (×2): 40 mg via INTRAVENOUS
  Filled 2022-11-07 (×2): qty 4

## 2022-11-07 MED ORDER — SODIUM CHLORIDE 0.9 % IV SOLN: INTRAVENOUS | Status: DC | PRN

## 2022-11-07 MED ORDER — MUPIROCIN 2 % EX OINT
1.0000 | TOPICAL_OINTMENT | Freq: Two times a day (BID) | CUTANEOUS | Status: AC
  Administered 2022-11-07 – 2022-11-11 (×10): 1 via NASAL
  Filled 2022-11-07 (×2): qty 22

## 2022-11-07 NOTE — Progress Notes (Signed)
Spouse (significant other) took prostetic eye home last pm @1830 .

## 2022-11-07 NOTE — Progress Notes (Addendum)
eLink Physician-Brief Progress Note Patient Name: Austin Berry DOB: 1965/07/01 MRN: [id number]   Date of Service  11/07/2022  HPI/Events of Note  57 year old initially presented with acute respiratory failure with angioedema and Pseudomonas pneumonia found to have acute metabolic encephalopathy with persistent ICU delirium  eICU Interventions  Renew wrist restraints for patient safety   0459 - Triglycerides 1112 this morning.  Will transition the patient off of propofol and transition to Versed.  The patient had poor sedative action with Precedex.  Bolus one-time Versed.  Maintain daily spontaneous awakening and breathing trials.   Intervention Category Minor Interventions: Routine modifications to care plan (e.g. PRN medications for pain, fever)    11/07/2022, 9:13 PM
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
## 2022-11-07 NOTE — Progress Notes (Signed)
EKG collected by RN due to telemetry reading STEMI. Patient also has had FiO2 increased to 100% with PEEP of 8 due to desaturations.  Exam notable for good air movement bilaterally with overlying mechanical ventilation sounds. Cardiac exam mildly tachycardic with regular rhythm.  EKG with ? new LBBB and T wave inversions from prior. Troponins STAT ordered. Increasing O2 needs also worrisome; CXR ordered to rule out worsening pulmonary process. ABG ordered and mildly more acidotic from prior though with normal pCO2 and pO2. Bicarb remains low though is increased from prior. Will follow studies.

## 2022-11-08 ENCOUNTER — Inpatient Hospital Stay (HOSPITAL_COMMUNITY): Payer: Medicaid Other

## 2022-11-08 DIAGNOSIS — T783XXA Angioneurotic edema, initial encounter: Secondary | ICD-10-CM | POA: Diagnosis not present

## 2022-11-08 DIAGNOSIS — J9601 Acute respiratory failure with hypoxia: Secondary | ICD-10-CM | POA: Diagnosis not present

## 2022-11-08 LAB — GLUCOSE, CAPILLARY
Glucose-Capillary: 164 mg/dL — ABNORMAL HIGH (ref 70–99)
Glucose-Capillary: 156 mg/dL — ABNORMAL HIGH (ref 70–99)
Glucose-Capillary: 185 mg/dL — ABNORMAL HIGH (ref 70–99)
Glucose-Capillary: 228 mg/dL — ABNORMAL HIGH (ref 70–99)
Glucose-Capillary: 261 mg/dL — ABNORMAL HIGH (ref 70–99)
Glucose-Capillary: 256 mg/dL — ABNORMAL HIGH (ref 70–99)

## 2022-11-08 LAB — LIPASE, BLOOD: Lipase: 30 U/L (ref 11–51)

## 2022-11-08 MED ORDER — CLONAZEPAM 1 MG PO TABS
2.0000 mg | ORAL_TABLET | Freq: Two times a day (BID) | ORAL | Status: DC
  Administered 2022-11-08 – 2022-11-09 (×2): 2 mg
  Filled 2022-11-08 (×2): qty 2

## 2022-11-08 MED ORDER — INSULIN ASPART 100 UNIT/ML IJ SOLN
0.0000 [IU] | INTRAMUSCULAR | Status: DC
  Administered 2022-11-08 (×2): 8 [IU] via SUBCUTANEOUS
  Administered 2022-11-08: 5 [IU] via SUBCUTANEOUS
  Administered 2022-11-09: 8 [IU] via SUBCUTANEOUS
  Administered 2022-11-09: 3 [IU] via SUBCUTANEOUS
  Administered 2022-11-09: 8 [IU] via SUBCUTANEOUS
  Administered 2022-11-09: 3 [IU] via SUBCUTANEOUS
  Administered 2022-11-09 (×2): 5 [IU] via SUBCUTANEOUS
  Administered 2022-11-10: 3 [IU] via SUBCUTANEOUS
  Administered 2022-11-10 – 2022-11-11 (×7): 5 [IU] via SUBCUTANEOUS

## 2022-11-08 MED ORDER — MIDAZOLAM HCL 2 MG/2ML IJ SOLN
4.0000 mg | Freq: Once | INTRAMUSCULAR | Status: AC
  Administered 2022-11-08: 4 mg via INTRAVENOUS

## 2022-11-08 MED ORDER — OXYCODONE HCL 5 MG PO TABS
10.0000 mg | ORAL_TABLET | Freq: Four times a day (QID) | ORAL | Status: DC
  Administered 2022-11-08 – 2022-11-10 (×8): 10 mg
  Filled 2022-11-08 (×8): qty 2

## 2022-11-08 MED ORDER — FAMOTIDINE 20 MG PO TABS
20.0000 mg | ORAL_TABLET | Freq: Every day | ORAL | Status: DC
  Administered 2022-11-09 – 2022-11-10 (×2): 20 mg
  Filled 2022-11-08 (×2): qty 1

## 2022-11-08 MED ORDER — INSULIN ASPART 100 UNIT/ML IJ SOLN: 0.0000 [IU] | Freq: Three times a day (TID) | INTRAMUSCULAR | Status: DC

## 2022-11-08 MED ORDER — MIDAZOLAM-SODIUM CHLORIDE 100-0.9 MG/100ML-% IV SOLN
0.0000 mg/h | INTRAVENOUS | Status: DC
  Administered 2022-11-08: 0.5 mg/h via INTRAVENOUS
  Administered 2022-11-09: 3.5 mg/h via INTRAVENOUS
  Filled 2022-11-08 (×2): qty 100

## 2022-11-08 MED ORDER — ACETAMINOPHEN 325 MG PO TABS
650.0000 mg | ORAL_TABLET | Freq: Four times a day (QID) | ORAL | Status: DC | PRN
  Administered 2022-11-08 – 2022-11-10 (×4): 650 mg
  Filled 2022-11-08 (×4): qty 2

## 2022-11-08 MED ORDER — VITAL 1.5 CAL PO LIQD
1000.0000 mL | ORAL | Status: DC
  Administered 2022-11-08 – 2022-11-12 (×6): 1000 mL

## 2022-11-08 MED ORDER — FUROSEMIDE 10 MG/ML IJ SOLN
80.0000 mg | Freq: Four times a day (QID) | INTRAMUSCULAR | Status: AC
  Administered 2022-11-08 (×3): 80 mg via INTRAVENOUS
  Filled 2022-11-08 (×3): qty 8

## 2022-11-08 MED ORDER — FENTANYL CITRATE (PF) 2500 MCG/50ML IJ SOLN
0.0000 ug/h | Status: DC
  Administered 2022-11-08: 25 ug/h via INTRAVENOUS
  Administered 2022-11-09: 100 ug/h via INTRAVENOUS
  Filled 2022-11-08 (×2): qty 100

## 2022-11-08 MED ORDER — PROSOURCE TF20 ENFIT COMPATIBL EN LIQD
60.0000 mL | Freq: Two times a day (BID) | ENTERAL | Status: DC
  Administered 2022-11-08 – 2022-11-12 (×9): 60 mL
  Filled 2022-11-08 (×9): qty 60

## 2022-11-08 NOTE — TOC Progression Note (Signed)
Transition of Care [facility]) - Progression Note    Patient Details  Name: Austin Berry MRN: [id number] Date of Birth: 1965/06/29  Transition of Care [facility]) CM/SW Contact  Tom-Johnson, Hershal Coria, RN Phone Number: 11/08/2022, 3:30 PM  Clinical Narrative:     Patient continues to be Intubated abd sedated. Possible Trach placement, daughter updated by MD.    CM will continue to follow.       Expected Discharge Plan and Services                                               Social Determinants of Health (SDOH) Interventions SDOH Screenings   Tobacco Use: High Risk (10/29/2022)    Readmission Risk Interventions    10/30/2022    1:17 PM  Readmission Risk Prevention Plan  Transportation Screening Complete  PCP or Specialist Appt within 3-5 Days Complete  HRI or Home Care Consult Complete  Social Work Consult for Recovery Care Planning/Counseling Complete  Palliative Care Screening Not Applicable  Medication Review [employer]) Referral to Pharmacy

## 2022-11-08 NOTE — Progress Notes (Signed)
eLink Physician-Brief Progress Note Patient Name: Austin Berry DOB: April 14, 1965 MRN: [id number]   Date of Service  11/08/2022  HPI/Events of Note  Received request for renewal of restraints Patient seen intubated and a risk for self harm by pulling lines and tubes  eICU Interventions  Bilateral soft wrist restraints renewed Bedside team to assess in am if restraints to be continued     Intervention Category Intermediate Interventions: Other:  Darl Pikes 11/08/2022, 11:33 PM

## 2022-11-08 NOTE — Plan of Care (Signed)
  Problem: Clinical Measurements: Goal: Ability to maintain clinical measurements within normal limits will improve Outcome: Progressing   Problem: Nutrition: Goal: Adequate nutrition will be maintained Outcome: Progressing   Problem: Elimination: Goal: Will not experience complications related to urinary retention Outcome: Progressing   Problem: Pain Managment: Goal: General experience of comfort will improve Outcome: Progressing   Problem: Safety: Goal: Ability to remain free from injury will improve Outcome: Progressing   Problem: Skin Integrity: Goal: Risk for impaired skin integrity will decrease Outcome: Progressing   Problem: Fluid Volume: Goal: Ability to maintain a balanced intake and output will improve Outcome: Progressing   Problem: Nutritional: Goal: Maintenance of adequate nutrition will improve Outcome: Progressing   Problem: Skin Integrity: Goal: Risk for impaired skin integrity will decrease Outcome: Progressing   Problem: Tissue Perfusion: Goal: Adequacy of tissue perfusion will improve Outcome: Progressing

## 2022-11-08 NOTE — Plan of Care (Signed)
  Problem: Education: Goal: Knowledge of General Education information will improve Description: Including pain rating scale, medication(s)/side effects and non-pharmacologic comfort measures Outcome: Not Progressing   Problem: Clinical Measurements: Goal: Ability to maintain clinical measurements within normal limits will improve Outcome: Not Progressing Goal: Will remain free from infection Outcome: Not Progressing Goal: Diagnostic test results will improve Outcome: Not Progressing Goal: Respiratory complications will improve Outcome: Not Progressing Goal: Cardiovascular complication will be avoided Outcome: Progressing

## 2022-11-08 NOTE — Procedures (Signed)
Cortrak  Person Inserting Tube:  Mahala Menghini, RD Tube Type:  Cortrak - 43 inches Tube Size:  10 Tube Location:  Left nare Secured by: Bridle Technique Used to Measure Tube Placement:  Marking at nare/corner of mouth Cortrak Secured At:  84 cm   Cortrak Tube Team Note:  Consult received to place a Cortrak feeding tube.   X-ray is required, abdominal x-ray has been ordered by the Cortrak team. Please confirm tube placement before using the Cortrak tube.   If the tube becomes dislodged please keep the tube and contact the Cortrak team at [email or web address] for replacement.  If after hours and replacement cannot be delayed, place a NG tube and confirm placement with an abdominal x-ray.    Mertie Clause, MS, RD, LDN Registered Dietitian II Please see AMiON for contact information.

## 2022-11-08 NOTE — Progress Notes (Signed)
Pharmacy Antibiotic Note  Austin Berry is a 57 y.o. male admitted on 10/29/2022 with pneumonia and bacteremia.  Pharmacy has been consulted for cefepime and vancomycin dosing.  WBC improved to 9.5, wnl. Tm 99 Slightly improved vent oxygen requirements (PEEP 8, FiO2 90) Scr elevated 2.32 due to daily diureses  Plan: Continue cefepime 2 g IV every 12 hours, stop date set for 8/13 Monitor CBC, renal function, and signs of clinical improvement  Height: 6' 0.01" (182.9 cm) Weight: 85.4 kg (188 lb 4.4 oz) IBW/kg (Calculated) : 77.62  Temp (24hrs), Avg:99 F (37.2 C), Min:98 F (36.7 C), Max:100 F (37.8 C)  Recent Labs  Lab 11/04/22 0741 11/04/22 1028 11/05/22 0338 11/06/22 0243 11/07/22 0212 11/08/22 0305  WBC 12.8*  --  10.5 9.0 9.1 9.5  CREATININE  --  1.87* 1.81* 1.66* 1.78* 2.32*    Estimated Creatinine Clearance: 38.1 mL/min (A) (by C-G formula based on SCr of 2.32 mg/dL (H)).    Allergies  Allergen Reactions   Ace Inhibitors Swelling    10/30/22 angioedema   Tramadol Itching    Antimicrobials this admission: 7/31 azithro x1 dose 7/31 CTX x 1 dose, 8/1 x1 dose, 8/2>>8/6 8/1 cefepime> 8/2, 8/6>> [8/13] 8/6 vancomycin>>8/8  Microbiology results: 8/5 Resp cx: pan sensitive pseudomonas 8/1 UA: rare bacteria 7/30 MRSA PCR: Neg  8/6 MRSA PCR: Pos  7/30 Bcx: 1/2> Pantoea agglomerans   Thank you for allowing pharmacy to be a part of this patient's care.  Stephenie Acres, PharmD PGY1 Pharmacy Resident 11/08/2022 10:03 AM
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
## 2022-11-08 NOTE — Progress Notes (Signed)
Nutrition Follow-up  DOCUMENTATION CODES:   Not applicable  INTERVENTION:  - Consider exchanging OG tube for Cortrak and bridle  Continue enteral nutrition via OG tube: - Change to Vital 1.5 @ 55 ml/hr (1320 ml/day) - PROSource TF20 60 ml BID  Tube feeding regimen provides 2140 kcal, 129 grams of protein, and 1008 ml of H2O.  NUTRITION DIAGNOSIS:  Inadequate oral intake related to inability to eat as evidenced by NPO status.  Ongoing, being addressed via TF  GOAL:   Patient will meet greater than or equal to 90% of their needs  Met via TF at goal rate  MONITOR:  Vent status, TF tolerance, Labs, Weight trends, I & O's  REASON FOR ASSESSMENT:  Consult Enteral/tube feeding initiation and management  ASSESSMENT:  Pt with hx of HTN, DM, EtOH abuse, and blindness presented to [facility] ED with possible allergic reaction and tongue swelling. Intubated in ED and transferred to [facility] for management  7/30 - presented to ED, intubated 7/31 - transferred to [facility] 8/02 - extubated, later reintubated 8/04 - emesis x 1, TF held, OG tube to [facility] 8/05 - TF restarted  Pt with ongoing agitated delirium. Plan is to increase enteral sedation. Tracheostomy is being considered.  Pt now off propofol due to hypertriglyceridemia. Pt now sedated with versed. Since pt is no longer receiving kcal from lipid in propofol, will transition to a more concentrated TF formula to meet nutrition needs. Discussed with RN.  Weight down ~1 kg compared to admit weight. Pt with moderate pitting generalized edema, moderate pitting edema to BUE, and deep pitting edema to BLE. Unsure of true dry weight.  Admit weight: 86.2 kg Current weight: 85.4 kg  Current TF: Vital AF 1.2 @ 55 ml/hr, PROSource TF20 60 ml daily  Patient remains intubated on ventilator support Temp (24hrs), Avg:99.3 F (37.4 C), Min:98.4 F (36.9 C), Max:100 F (37.8 C)  Drips: Fentanyl Versed  Medications reviewed and include:  pepcid, folic acid, IV lasix 80 mg x 3, SSI every 4 hours, melatonin, thiamine, IV abx  Labs reviewed: BUN 55, creatinine 2.32, phosphorus 6.6, TG 1112, hemoglobin 8.9 CBG's: 156-185 x 24 hours  UOP: 1350 ml x 24 hours I/O's: +10.8 L since admit  Diet Order:   Diet Order             Diet NPO time specified  Diet effective now                   EDUCATION NEEDS:   Not appropriate for education at this time  Skin:  Skin Assessment: Reviewed RN Assessment  Last BM:  11/07/22 medium type 6  Height:   Ht Readings from Last 1 Encounters:  11/08/22 6' 0.01" (1.829 m)    Weight:   Wt Readings from Last 1 Encounters:  11/08/22 85.4 kg    Ideal Body Weight:  80.9 kg  BMI:  Body mass index is 25.53 kg/m.  Estimated Nutritional Needs:   Kcal:  2100-2300 kcal/d  Protein:  110-130 grams  Fluid:  2.1-2.3 L    Mertie Clause, MS, RD, LDN Registered Dietitian II Please see AMiON for contact information.

## 2022-11-09 ENCOUNTER — Inpatient Hospital Stay (HOSPITAL_COMMUNITY): Payer: Medicaid Other

## 2022-11-09 DIAGNOSIS — N179 Acute kidney failure, unspecified: Secondary | ICD-10-CM

## 2022-11-09 DIAGNOSIS — J189 Pneumonia, unspecified organism: Secondary | ICD-10-CM | POA: Diagnosis not present

## 2022-11-09 DIAGNOSIS — J9601 Acute respiratory failure with hypoxia: Secondary | ICD-10-CM | POA: Diagnosis not present

## 2022-11-09 LAB — GLUCOSE, CAPILLARY
Glucose-Capillary: 289 mg/dL — ABNORMAL HIGH (ref 70–99)
Glucose-Capillary: 249 mg/dL — ABNORMAL HIGH (ref 70–99)
Glucose-Capillary: 285 mg/dL — ABNORMAL HIGH (ref 70–99)
Glucose-Capillary: 215 mg/dL — ABNORMAL HIGH (ref 70–99)
Glucose-Capillary: 182 mg/dL — ABNORMAL HIGH (ref 70–99)
Glucose-Capillary: 181 mg/dL — ABNORMAL HIGH (ref 70–99)

## 2022-11-09 LAB — BASIC METABOLIC PANEL
Anion gap: 13 (ref 5–15)
BUN: 86 mg/dL — ABNORMAL HIGH (ref 6–20)
CO2: 17 mmol/L — ABNORMAL LOW (ref 22–32)
Calcium: 9.7 mg/dL (ref 8.9–10.3)
Chloride: 109 mmol/L (ref 98–111)
Creatinine, Ser: 3.5 mg/dL — ABNORMAL HIGH (ref 0.61–1.24)
GFR, Estimated: 19 mL/min — ABNORMAL LOW (ref >60)
Glucose, Bld: 212 mg/dL — ABNORMAL HIGH (ref 70–99)
Potassium: 5.4 mmol/L — ABNORMAL HIGH (ref 3.5–5.1)
Sodium: 139 mmol/L (ref 135–145)

## 2022-11-09 MED ORDER — INSULIN DETEMIR 100 UNIT/ML ~~LOC~~ SOLN
8.0000 [IU] | Freq: Every day | SUBCUTANEOUS | Status: DC
  Administered 2022-11-09: 8 [IU] via SUBCUTANEOUS
  Filled 2022-11-09 (×2): qty 0.08

## 2022-11-09 MED ORDER — INSULIN DETEMIR 100 UNIT/ML ~~LOC~~ SOLN
6.0000 [IU] | Freq: Two times a day (BID) | SUBCUTANEOUS | Status: DC
  Administered 2022-11-09: 6 [IU] via SUBCUTANEOUS
  Filled 2022-11-09 (×3): qty 0.06

## 2022-11-09 MED ORDER — SODIUM CHLORIDE 0.9 % IV SOLN
2.0000 g | INTRAVENOUS | Status: DC
  Administered 2022-11-10: 2 g via INTRAVENOUS
  Filled 2022-11-09: qty 12.5

## 2022-11-09 MED ORDER — INSULIN DETEMIR 100 UNIT/ML ~~LOC~~ SOLN
4.0000 [IU] | Freq: Two times a day (BID) | SUBCUTANEOUS | Status: DC
  Filled 2022-11-09: qty 0.04

## 2022-11-09 MED ORDER — CLONAZEPAM 1 MG PO TABS
1.0000 mg | ORAL_TABLET | Freq: Three times a day (TID) | ORAL | Status: DC
  Administered 2022-11-09 – 2022-11-11 (×6): 1 mg
  Filled 2022-11-09 (×6): qty 1

## 2022-11-09 MED ORDER — ALBUTEROL SULFATE (2.5 MG/3ML) 0.083% IN NEBU
2.5000 mg | INHALATION_SOLUTION | RESPIRATORY_TRACT | Status: DC | PRN
  Administered 2022-11-11 – 2022-11-13 (×7): 2.5 mg via RESPIRATORY_TRACT
  Filled 2022-11-09 (×8): qty 3

## 2022-11-09 MED ORDER — BISACODYL 10 MG RE SUPP: 10.0000 mg | Freq: Every day | RECTAL | Status: DC | PRN

## 2022-11-09 MED ORDER — FUROSEMIDE 10 MG/ML IJ SOLN
80.0000 mg | Freq: Three times a day (TID) | INTRAMUSCULAR | Status: DC
  Administered 2022-11-09 – 2022-11-10 (×2): 80 mg via INTRAVENOUS
  Filled 2022-11-09 (×2): qty 8

## 2022-11-09 MED ORDER — INSULIN ASPART 100 UNIT/ML IJ SOLN
4.0000 [IU] | INTRAMUSCULAR | Status: DC
  Administered 2022-11-09 – 2022-11-10 (×6): 4 [IU] via SUBCUTANEOUS

## 2022-11-09 MED ORDER — POLYETHYLENE GLYCOL 3350 17 G PO PACK
17.0000 g | PACK | Freq: Two times a day (BID) | ORAL | Status: DC
  Administered 2022-11-09 – 2022-11-12 (×6): 17 g
  Filled 2022-11-09 (×6): qty 1

## 2022-11-09 MED ORDER — FUROSEMIDE 10 MG/ML IJ SOLN
80.0000 mg | Freq: Three times a day (TID) | INTRAMUSCULAR | Status: DC
  Administered 2022-11-09: 80 mg via INTRAVENOUS
  Filled 2022-11-09 (×2): qty 8

## 2022-11-09 MED ORDER — METOLAZONE 5 MG PO TABS
5.0000 mg | ORAL_TABLET | Freq: Once | ORAL | Status: AC
  Administered 2022-11-09: 5 mg
  Filled 2022-11-09: qty 1

## 2022-11-09 MED ORDER — CHLORHEXIDINE GLUCONATE CLOTH 2 % EX PADS
6.0000 | MEDICATED_PAD | CUTANEOUS | Status: DC
  Administered 2022-11-09 – 2022-11-12 (×4): 6 via TOPICAL

## 2022-11-09 MED ORDER — BISACODYL 10 MG RE SUPP
10.0000 mg | Freq: Once | RECTAL | Status: AC
  Administered 2022-11-09: 10 mg via RECTAL
  Filled 2022-11-09: qty 1

## 2022-11-09 NOTE — Progress Notes (Signed)
Pharmacy Antibiotic Note  Austin Berry is a 57 y.o. male admitted on 10/29/2022 with pneumonia and bacteremia.  Pharmacy has been consulted for cefepime and vancomycin dosing.  WBC 13.8, up from 9.5 on 8/9, wnl. Tm 99 Slightly improved vent oxygen requirements (PEEP 8, FiO2 90) Scr elevated at 3.5 (BL ~2) due to daily diureses  Plan: Decrease cefepime from 2 g IV every 12 hours to 2 g IV every 24 hours, stop date set for 8/12 Monitor CBC, renal function, and signs of clinical improvement  Height: 6' 0.01" (182.9 cm) Weight: 85.4 kg (188 lb 4.4 oz) IBW/kg (Calculated) : 77.62  Temp (24hrs), Avg:99.3 F (37.4 C), Min:98.7 F (37.1 C), Max:99.9 F (37.7 C)  Recent Labs  Lab 11/05/22 0338 11/06/22 0243 11/07/22 0212 11/08/22 0305 11/09/22 0115 11/09/22 1946  WBC 10.5 9.0 9.1 9.5 13.8*  --   CREATININE 1.81* 1.66* 1.78* 2.32* 3.00* 3.50*    Estimated Creatinine Clearance: 25.3 mL/min (A) (by C-G formula based on SCr of 3.5 mg/dL (H)).    Allergies  Allergen Reactions   Ace Inhibitors Swelling    10/30/22 angioedema   Tramadol Itching    Antimicrobials this admission: 7/31 azithro x1 dose 7/31 CTX x 1 dose, 8/1 x1 dose, 8/2>>8/6 8/1 cefepime> 8/2, 8/6>> [8/13] 8/6 vancomycin>>8/8  Microbiology results: 8/5 Resp cx: pan sensitive pseudomonas 8/1 UA: rare bacteria 7/30 MRSA PCR: Neg  8/6 MRSA PCR: Pos  7/30 Bcx: 1/2> Pantoea agglomerans   Thank you for allowing pharmacy to be a part of this patient's care.  Roslyn Smiling, PharmD PGY1 Pharmacy Resident 11/09/2022 9:05 PM

## 2022-11-09 NOTE — Progress Notes (Signed)
eLink Physician-Brief Progress Note Patient Name: Austin Berry DOB: 1966/01/04 MRN: [id number]   Date of Service  11/09/2022  HPI/Events of Note  CBGs trending up now 289 Tolerating tube feeding Creatinine 3  eICU Interventions  Start levemir 8 units then bedside rounding team to titrate accordingly     Intervention Category Intermediate Interventions: Hyperglycemia - evaluation and treatment  Rosalie Gums  11/09/2022, 3:58 AM

## 2022-11-09 NOTE — Plan of Care (Signed)
  Problem: Education: Goal: Knowledge of General Education information will improve Description: Including pain rating scale, medication(s)/side effects and non-pharmacologic comfort measures Outcome: Progressing   Problem: Clinical Measurements: Goal: Ability to maintain clinical measurements within normal limits will improve Outcome: Progressing Goal: Will remain free from infection Outcome: Progressing Goal: Diagnostic test results will improve Outcome: Progressing Goal: Respiratory complications will improve Outcome: Progressing Goal: Cardiovascular complication will be avoided Outcome: Progressing   Problem: Nutrition: Goal: Adequate nutrition will be maintained Outcome: Progressing   Problem: Elimination: Goal: Will not experience complications related to bowel motility Outcome: Progressing Goal: Will not experience complications related to urinary retention Outcome: Progressing   Problem: Pain Managment: Goal: General experience of comfort will improve Outcome: Progressing   Problem: Safety: Goal: Ability to remain free from injury will improve Outcome: Progressing   Problem: Skin Integrity: Goal: Risk for impaired skin integrity will decrease Outcome: Progressing   Problem: Fluid Volume: Goal: Ability to maintain a balanced intake and output will improve Outcome: Progressing   Problem: Metabolic: Goal: Ability to maintain appropriate glucose levels will improve Outcome: Progressing   Problem: Nutritional: Goal: Maintenance of adequate nutrition will improve Outcome: Progressing   Problem: Skin Integrity: Goal: Risk for impaired skin integrity will decrease Outcome: Progressing   Problem: Tissue Perfusion: Goal: Adequacy of tissue perfusion will improve Outcome: Progressing   Problem: Safety: Goal: Non-violent Restraint(s) Outcome: Progressing

## 2022-11-09 NOTE — Plan of Care (Signed)
  Problem: Education: Goal: Knowledge of General Education information will improve Description: Including pain rating scale, medication(s)/side effects and non-pharmacologic comfort measures Outcome: Not Progressing   Problem: Clinical Measurements: Goal: Ability to maintain clinical measurements within normal limits will improve Outcome: Progressing Goal: Will remain free from infection Outcome: Progressing Goal: Diagnostic test results will improve Outcome: Progressing Goal: Respiratory complications will improve Outcome: Progressing Goal: Cardiovascular complication will be avoided Outcome: Progressing

## 2022-11-09 NOTE — Plan of Care (Signed)
  Problem: Elimination: Goal: Will not experience complications related to urinary retention Outcome: Progressing   Problem: Pain Managment: Goal: General experience of comfort will improve Outcome: Not Progressing   Problem: Safety: Goal: Ability to remain free from injury will improve Outcome: Not Progressing   Problem: Skin Integrity: Goal: Risk for impaired skin integrity will decrease Outcome: Not Progressing   Problem: Metabolic: Goal: Ability to maintain appropriate glucose levels will improve Outcome: Not Progressing   Problem: Nutritional: Goal: Maintenance of adequate nutrition will improve Outcome: Progressing   Problem: Skin Integrity: Goal: Risk for impaired skin integrity will decrease Outcome: Not Progressing
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
## 2022-11-10 ENCOUNTER — Encounter (HOSPITAL_COMMUNITY): Payer: Self-pay | Admitting: Internal Medicine

## 2022-11-10 ENCOUNTER — Inpatient Hospital Stay (HOSPITAL_COMMUNITY): Payer: Medicaid Other

## 2022-11-10 DIAGNOSIS — J9601 Acute respiratory failure with hypoxia: Secondary | ICD-10-CM | POA: Diagnosis not present

## 2022-11-10 LAB — ECHOCARDIOGRAM COMPLETE
AR max vel: 0.57 cm2
AV Peak grad: 14.1 mmHg
Ao pk vel: 1.88 m/s
Area-P 1/2: 6.17 cm2
Height: 72.008 in
S' Lateral: 2.7 cm
Weight: 2934.76 [oz_av]

## 2022-11-10 LAB — GLUCOSE, CAPILLARY
Glucose-Capillary: 210 mg/dL — ABNORMAL HIGH (ref 70–99)
Glucose-Capillary: 194 mg/dL — ABNORMAL HIGH (ref 70–99)
Glucose-Capillary: 218 mg/dL — ABNORMAL HIGH (ref 70–99)
Glucose-Capillary: 247 mg/dL — ABNORMAL HIGH (ref 70–99)
Glucose-Capillary: 228 mg/dL — ABNORMAL HIGH (ref 70–99)
Glucose-Capillary: 223 mg/dL — ABNORMAL HIGH (ref 70–99)

## 2022-11-10 LAB — RENAL FUNCTION PANEL
Albumin: 2 g/dL — ABNORMAL LOW (ref 3.5–5.0)
Anion gap: 16 — ABNORMAL HIGH (ref 5–15)
BUN: 87 mg/dL — ABNORMAL HIGH (ref 6–20)
CO2: 18 mmol/L — ABNORMAL LOW (ref 22–32)
Calcium: 9.9 mg/dL (ref 8.9–10.3)
Chloride: 105 mmol/L (ref 98–111)
Creatinine, Ser: 3.78 mg/dL — ABNORMAL HIGH (ref 0.61–1.24)
GFR, Estimated: 18 mL/min — ABNORMAL LOW (ref >60)
Glucose, Bld: 201 mg/dL — ABNORMAL HIGH (ref 70–99)
Phosphorus: 6.6 mg/dL — ABNORMAL HIGH (ref 2.5–4.6)
Potassium: 5.4 mmol/L — ABNORMAL HIGH (ref 3.5–5.1)
Sodium: 139 mmol/L (ref 135–145)

## 2022-11-10 LAB — CBC
HCT: 27.1 % — ABNORMAL LOW (ref 39.0–52.0)
Hemoglobin: 8.6 g/dL — ABNORMAL LOW (ref 13.0–17.0)
MCH: 32.7 pg (ref 26.0–34.0)
MCHC: 31.7 g/dL (ref 30.0–36.0)
MCV: 103 fL — ABNORMAL HIGH (ref 80.0–100.0)
Platelets: 161 10*3/uL (ref 150–400)
RBC: 2.63 MIL/uL — ABNORMAL LOW (ref 4.22–5.81)
RDW: 15.9 % — ABNORMAL HIGH (ref 11.5–15.5)
WBC: 13.8 10*3/uL — ABNORMAL HIGH (ref 4.0–10.5)
nRBC: 0.1 % (ref 0.0–0.2)

## 2022-11-10 LAB — TRIGLYCERIDES: Triglycerides: 280 mg/dL — ABNORMAL HIGH (ref <150)

## 2022-11-10 MED ORDER — SODIUM BICARBONATE 650 MG PO TABS
1300.0000 mg | ORAL_TABLET | Freq: Two times a day (BID) | ORAL | Status: AC
  Administered 2022-11-10 (×2): 1300 mg
  Filled 2022-11-10 (×2): qty 2

## 2022-11-10 MED ORDER — QUETIAPINE FUMARATE 100 MG PO TABS
100.0000 mg | ORAL_TABLET | Freq: Two times a day (BID) | ORAL | Status: DC
  Filled 2022-11-10: qty 1

## 2022-11-10 MED ORDER — METOLAZONE 5 MG PO TABS
5.0000 mg | ORAL_TABLET | Freq: Once | ORAL | Status: AC
  Administered 2022-11-10: 5 mg
  Filled 2022-11-10: qty 1

## 2022-11-10 MED ORDER — OXYCODONE HCL 5 MG PO TABS
5.0000 mg | ORAL_TABLET | Freq: Four times a day (QID) | ORAL | Status: DC
  Administered 2022-11-10 – 2022-11-13 (×12): 5 mg
  Filled 2022-11-10 (×13): qty 1

## 2022-11-10 MED ORDER — SODIUM ZIRCONIUM CYCLOSILICATE 10 G PO PACK
10.0000 g | PACK | Freq: Three times a day (TID) | ORAL | Status: AC
  Administered 2022-11-10 (×2): 10 g
  Filled 2022-11-10 (×2): qty 1

## 2022-11-10 MED ORDER — FENTANYL CITRATE PF 50 MCG/ML IJ SOSY
50.0000 ug | PREFILLED_SYRINGE | INTRAMUSCULAR | Status: DC | PRN
  Administered 2022-11-10 – 2022-11-11 (×6): 100 ug via INTRAVENOUS
  Administered 2022-11-12 – 2022-11-13 (×2): 200 ug via INTRAVENOUS
  Filled 2022-11-10: qty 2
  Filled 2022-11-10: qty 4
  Filled 2022-11-10 (×4): qty 2

## 2022-11-10 MED ORDER — BISACODYL 10 MG RE SUPP
10.0000 mg | Freq: Once | RECTAL | Status: AC
  Administered 2022-11-10: 10 mg via RECTAL
  Filled 2022-11-10: qty 1

## 2022-11-10 MED ORDER — INSULIN ASPART 100 UNIT/ML IJ SOLN
7.0000 [IU] | INTRAMUSCULAR | Status: DC
  Administered 2022-11-10 – 2022-11-12 (×15): 7 [IU] via SUBCUTANEOUS

## 2022-11-10 MED ORDER — FUROSEMIDE 10 MG/ML IJ SOLN
120.0000 mg | Freq: Two times a day (BID) | INTRAVENOUS | Status: AC
  Administered 2022-11-10 (×2): 120 mg via INTRAVENOUS
  Filled 2022-11-10: qty 10
  Filled 2022-11-10: qty 2
  Filled 2022-11-10 (×2): qty 12

## 2022-11-10 MED ORDER — DEXMEDETOMIDINE HCL IN NACL 400 MCG/100ML IV SOLN
0.0000 ug/kg/h | INTRAVENOUS | Status: DC
  Administered 2022-11-10 (×2): 0.4 ug/kg/h via INTRAVENOUS
  Administered 2022-11-11: 0.8 ug/kg/h via INTRAVENOUS
  Administered 2022-11-11: 0.6 ug/kg/h via INTRAVENOUS
  Administered 2022-11-11 (×2): 1.2 ug/kg/h via INTRAVENOUS
  Administered 2022-11-12 (×2): 0.8 ug/kg/h via INTRAVENOUS
  Administered 2022-11-12: 1 ug/kg/h via INTRAVENOUS
  Administered 2022-11-13 (×2): 1.2 ug/kg/h via INTRAVENOUS
  Filled 2022-11-10 (×12): qty 100

## 2022-11-10 MED ORDER — INSULIN DETEMIR 100 UNIT/ML ~~LOC~~ SOLN
10.0000 [IU] | Freq: Two times a day (BID) | SUBCUTANEOUS | Status: DC
  Administered 2022-11-10: 10 [IU] via SUBCUTANEOUS
  Filled 2022-11-10 (×2): qty 0.1

## 2022-11-10 MED ORDER — QUETIAPINE FUMARATE 50 MG PO TABS
150.0000 mg | ORAL_TABLET | Freq: Two times a day (BID) | ORAL | Status: DC
  Administered 2022-11-10 – 2022-11-13 (×7): 150 mg
  Filled 2022-11-10 (×7): qty 1

## 2022-11-10 MED ORDER — INSULIN DETEMIR 100 UNIT/ML ~~LOC~~ SOLN
12.0000 [IU] | Freq: Two times a day (BID) | SUBCUTANEOUS | Status: DC
  Administered 2022-11-10: 12 [IU] via SUBCUTANEOUS
  Filled 2022-11-10 (×3): qty 0.12

## 2022-11-10 NOTE — Progress Notes (Signed)
Echocardiogram 2D Echocardiogram has been performed.  Irish Lack, RDCS 11/10/2022, 2:26 PM
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
## 2022-11-10 NOTE — Progress Notes (Signed)
Back on full support at this time due to WOB
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
## 2022-11-10 NOTE — Plan of Care (Signed)
  Problem: Education: Goal: Knowledge of General Education information will improve Description: Including pain rating scale, medication(s)/side effects and non-pharmacologic comfort measures Outcome: Not Progressing   Problem: Clinical Measurements: Goal: Ability to maintain clinical measurements within normal limits will improve Outcome: Not Progressing Goal: Will remain free from infection Outcome: Not Progressing Goal: Diagnostic test results will improve Outcome: Not Progressing Goal: Respiratory complications will improve Outcome: Not Progressing Goal: Cardiovascular complication will be avoided Outcome: Not Progressing   Problem: Nutrition: Goal: Adequate nutrition will be maintained Outcome: Not Progressing   Problem: Elimination: Goal: Will not experience complications related to bowel motility Outcome: Not Progressing Goal: Will not experience complications related to urinary retention Outcome: Not Progressing   Problem: Pain Managment: Goal: General experience of comfort will improve Outcome: Not Progressing   Problem: Safety: Goal: Ability to remain free from injury will improve Outcome: Not Progressing   Problem: Skin Integrity: Goal: Risk for impaired skin integrity will decrease Outcome: Not Progressing   Problem: Fluid Volume: Goal: Ability to maintain a balanced intake and output will improve Outcome: Not Progressing   Problem: Metabolic: Goal: Ability to maintain appropriate glucose levels will improve Outcome: Not Progressing   Problem: Nutritional: Goal: Maintenance of adequate nutrition will improve Outcome: Not Progressing   Problem: Skin Integrity: Goal: Risk for impaired skin integrity will decrease Outcome: Not Progressing   Problem: Tissue Perfusion: Goal: Adequacy of tissue perfusion will improve Outcome: Not Progressing   Problem: Safety: Goal: Non-violent Restraint(s) Outcome: Not Progressing

## 2022-11-10 NOTE — Progress Notes (Signed)
CPT held due to pt agitation. Will attempt at later time when pt is calm.

## 2022-11-10 NOTE — Progress Notes (Signed)
Wasted 75ml of concentrated Fentanyl in Immunologist with Jacquenette Shone, RN

## 2022-11-11 ENCOUNTER — Inpatient Hospital Stay (HOSPITAL_COMMUNITY): Payer: Medicaid Other

## 2022-11-11 DIAGNOSIS — J151 Pneumonia due to Pseudomonas: Secondary | ICD-10-CM

## 2022-11-11 DIAGNOSIS — J9601 Acute respiratory failure with hypoxia: Secondary | ICD-10-CM | POA: Diagnosis not present

## 2022-11-11 DIAGNOSIS — T783XXD Angioneurotic edema, subsequent encounter: Secondary | ICD-10-CM | POA: Diagnosis not present

## 2022-11-11 LAB — POCT I-STAT 7, (LYTES, BLD GAS, ICA,H+H)
Acid-base deficit: 3 mmol/L — ABNORMAL HIGH (ref 0.0–2.0)
Acid-base deficit: 3 mmol/L — ABNORMAL HIGH (ref 0.0–2.0)
Acid-base deficit: 3 mmol/L — ABNORMAL HIGH (ref 0.0–2.0)
Bicarbonate: 22.1 mmol/L (ref 20.0–28.0)
Bicarbonate: 22.7 mmol/L (ref 20.0–28.0)
Bicarbonate: 23.4 mmol/L (ref 20.0–28.0)
Calcium, Ion: 1.21 mmol/L (ref 1.15–1.40)
Calcium, Ion: 1.21 mmol/L (ref 1.15–1.40)
Calcium, Ion: 1.23 mmol/L (ref 1.15–1.40)
HCT: 26 % — ABNORMAL LOW (ref 39.0–52.0)
HCT: 26 % — ABNORMAL LOW (ref 39.0–52.0)
HCT: 26 % — ABNORMAL LOW (ref 39.0–52.0)
Hemoglobin: 8.8 g/dL — ABNORMAL LOW (ref 13.0–17.0)
Hemoglobin: 8.8 g/dL — ABNORMAL LOW (ref 13.0–17.0)
Hemoglobin: 8.8 g/dL — ABNORMAL LOW (ref 13.0–17.0)
O2 Saturation: 80 %
O2 Saturation: 89 %
O2 Saturation: 92 %
Patient temperature: 98.6
Patient temperature: 37.2
Patient temperature: 37.2
Potassium: 5 mmol/L (ref 3.5–5.1)
Potassium: 5.1 mmol/L (ref 3.5–5.1)
Potassium: 5.2 mmol/L — ABNORMAL HIGH (ref 3.5–5.1)
Sodium: 141 mmol/L (ref 135–145)
Sodium: 142 mmol/L (ref 135–145)
Sodium: 142 mmol/L (ref 135–145)
TCO2: 23 mmol/L (ref 22–32)
TCO2: 24 mmol/L (ref 22–32)
TCO2: 25 mmol/L (ref 22–32)
pCO2 arterial: 38.8 mmHg (ref 32–48)
pCO2 arterial: 42.5 mmHg (ref 32–48)
pCO2 arterial: 45.9 mmHg (ref 32–48)
pH, Arterial: 7.364 (ref 7.35–7.45)
pH, Arterial: 7.317 — ABNORMAL LOW (ref 7.35–7.45)
pH, Arterial: 7.336 — ABNORMAL LOW (ref 7.35–7.45)
pO2, Arterial: 46 mmHg — ABNORMAL LOW (ref 83–108)
pO2, Arterial: 62 mmHg — ABNORMAL LOW (ref 83–108)
pO2, Arterial: 70 mmHg — ABNORMAL LOW (ref 83–108)

## 2022-11-11 LAB — BASIC METABOLIC PANEL
Anion gap: 17 — ABNORMAL HIGH (ref 5–15)
BUN: 116 mg/dL — ABNORMAL HIGH (ref 6–20)
CO2: 19 mmol/L — ABNORMAL LOW (ref 22–32)
Calcium: 9.6 mg/dL (ref 8.9–10.3)
Chloride: 103 mmol/L (ref 98–111)
Creatinine, Ser: 4.71 mg/dL — ABNORMAL HIGH (ref 0.61–1.24)
GFR, Estimated: 14 mL/min — ABNORMAL LOW (ref >60)
Glucose, Bld: 267 mg/dL — ABNORMAL HIGH (ref 70–99)
Potassium: 5.3 mmol/L — ABNORMAL HIGH (ref 3.5–5.1)
Sodium: 139 mmol/L (ref 135–145)

## 2022-11-11 LAB — CULTURE, RESPIRATORY W GRAM STAIN: Gram Stain: NONE SEEN

## 2022-11-11 LAB — GLUCOSE, CAPILLARY
Glucose-Capillary: 231 mg/dL — ABNORMAL HIGH (ref 70–99)
Glucose-Capillary: 222 mg/dL — ABNORMAL HIGH (ref 70–99)
Glucose-Capillary: 240 mg/dL — ABNORMAL HIGH (ref 70–99)
Glucose-Capillary: 215 mg/dL — ABNORMAL HIGH (ref 70–99)
Glucose-Capillary: 231 mg/dL — ABNORMAL HIGH (ref 70–99)
Glucose-Capillary: 175 mg/dL — ABNORMAL HIGH (ref 70–99)

## 2022-11-11 MED ORDER — INSULIN DETEMIR 100 UNIT/ML ~~LOC~~ SOLN
20.0000 [IU] | Freq: Two times a day (BID) | SUBCUTANEOUS | Status: DC
  Administered 2022-11-11 (×2): 20 [IU] via SUBCUTANEOUS
  Filled 2022-11-11 (×4): qty 0.2

## 2022-11-11 MED ORDER — ETOMIDATE 2 MG/ML IV SOLN
20.0000 mg | Freq: Once | INTRAVENOUS | Status: AC
  Administered 2022-11-11: 20 mg via INTRAVENOUS

## 2022-11-11 MED ORDER — INSULIN ASPART 100 UNIT/ML IV SOLN
5.0000 [IU] | Freq: Once | INTRAVENOUS | Status: AC
  Administered 2022-11-11: 5 [IU] via INTRAVENOUS

## 2022-11-11 MED ORDER — PIPERACILLIN-TAZOBACTAM IN DEX 2-0.25 GM/50ML IV SOLN
2.2500 g | Freq: Three times a day (TID) | INTRAVENOUS | Status: DC
  Administered 2022-11-11: 2.25 g via INTRAVENOUS
  Filled 2022-11-11: qty 50

## 2022-11-11 MED ORDER — ALBUMIN HUMAN 5 % IV SOLN
25.0000 g | Freq: Once | INTRAVENOUS | Status: AC
  Administered 2022-11-11: 25 g via INTRAVENOUS
  Filled 2022-11-11: qty 500

## 2022-11-11 MED ORDER — FUROSEMIDE 10 MG/ML IJ SOLN
120.0000 mg | Freq: Once | INTRAVENOUS | Status: AC
  Administered 2022-11-11: 120 mg via INTRAVENOUS
  Filled 2022-11-11: qty 4

## 2022-11-11 MED ORDER — ACETYLCYSTEINE 20 % IN SOLN
4.0000 mL | Freq: Three times a day (TID) | RESPIRATORY_TRACT | Status: DC
  Administered 2022-11-11 – 2022-11-13 (×7): 4 mL via RESPIRATORY_TRACT
  Filled 2022-11-11 (×9): qty 4

## 2022-11-11 MED ORDER — FENTANYL BOLUS VIA INFUSION
50.0000 ug | INTRAVENOUS | Status: DC | PRN
  Administered 2022-11-11 – 2022-11-13 (×10): 100 ug via INTRAVENOUS

## 2022-11-11 MED ORDER — SODIUM ZIRCONIUM CYCLOSILICATE 10 G PO PACK
10.0000 g | PACK | Freq: Every day | ORAL | Status: AC
  Administered 2022-11-11: 10 g
  Filled 2022-11-11: qty 1

## 2022-11-11 MED ORDER — FAMOTIDINE 20 MG PO TABS
10.0000 mg | ORAL_TABLET | Freq: Every day | ORAL | Status: DC
  Administered 2022-11-11 – 2022-11-13 (×3): 10 mg
  Filled 2022-11-11 (×3): qty 1

## 2022-11-11 MED ORDER — DOCUSATE SODIUM 50 MG/5ML PO LIQD
100.0000 mg | Freq: Two times a day (BID) | ORAL | Status: DC
  Administered 2022-11-12 – 2022-11-13 (×2): 100 mg
  Filled 2022-11-11 (×3): qty 10

## 2022-11-11 MED ORDER — DEXTROSE 50 % IV SOLN
1.0000 | Freq: Once | INTRAVENOUS | Status: AC
  Administered 2022-11-11: 50 mL via INTRAVENOUS
  Filled 2022-11-11: qty 50

## 2022-11-11 MED ORDER — SODIUM BICARBONATE 8.4 % IV SOLN
100.0000 meq | Freq: Once | INTRAVENOUS | Status: AC
  Administered 2022-11-11: 100 meq via INTRAVENOUS
  Filled 2022-11-11: qty 100

## 2022-11-11 MED ORDER — ROCURONIUM BROMIDE 10 MG/ML (PF) SYRINGE
100.0000 mg | PREFILLED_SYRINGE | Freq: Once | INTRAVENOUS | Status: AC
  Administered 2022-11-11: 100 mg via INTRAVENOUS

## 2022-11-11 MED ORDER — FENTANYL 2500MCG IN NS 250ML (10MCG/ML) PREMIX INFUSION
50.0000 ug/h | INTRAVENOUS | Status: DC
  Administered 2022-11-11: 50 ug/h via INTRAVENOUS
  Administered 2022-11-12: 200 ug/h via INTRAVENOUS
  Administered 2022-11-12: 150 ug/h via INTRAVENOUS
  Filled 2022-11-11 (×3): qty 250

## 2022-11-11 MED ORDER — INSULIN ASPART 100 UNIT/ML IJ SOLN
0.0000 [IU] | INTRAMUSCULAR | Status: DC
  Administered 2022-11-11: 4 [IU] via SUBCUTANEOUS
  Administered 2022-11-11 – 2022-11-12 (×4): 7 [IU] via SUBCUTANEOUS
  Administered 2022-11-12 – 2022-11-13 (×6): 4 [IU] via SUBCUTANEOUS

## 2022-11-11 MED ORDER — GABAPENTIN 250 MG/5ML PO SOLN
300.0000 mg | Freq: Two times a day (BID) | ORAL | Status: DC
  Administered 2022-11-11: 300 mg
  Filled 2022-11-11 (×2): qty 6

## 2022-11-11 MED ORDER — SODIUM CHLORIDE 0.9 % IV SOLN
1.0000 g | Freq: Two times a day (BID) | INTRAVENOUS | Status: DC
  Administered 2022-11-11 – 2022-11-13 (×5): 1 g via INTRAVENOUS
  Filled 2022-11-11 (×5): qty 20

## 2022-11-11 MED ORDER — FENTANYL CITRATE PF 50 MCG/ML IJ SOSY
50.0000 ug | PREFILLED_SYRINGE | Freq: Once | INTRAMUSCULAR | Status: AC
  Administered 2022-11-11: 50 ug via INTRAVENOUS

## 2022-11-11 NOTE — Progress Notes (Signed)
[facility] Progress Note   57 y.o. year-old w/ PMH presented 10/29/22 to ED after having an allergic reaction to shellfish, taking acei for his HTN. He had hibachi shrimp and chicken and started swelling up during the meal. In ED he got epinephrine, then deteriorated w/ tongue swelling and stridor. Nasal intubation was attempted. EDP intubated w/ ketamine. He rec'd pepcid and solumedrol. CXR showed L sided infiltrates and IV abx were started for possible CAP. Baseline creat was 1.7- 2. Pt arrive to hospital w/ creat 2.4. Pt required phenobarb taper for etoh withdrawal on 8/1. On 8/02 was extubated but had to be re-intubated due to encephalopathy, blood cx's were + from 7/30 for [employer] agglomerans and this is unusual and thought to be related to aspiration pna, so IV abx were given. Creat improved from 2.4 on 7/30 admission down to 1.6 on 8/07. However, since then creat has increased up to 3.78 today.  CVP is 9 today. Review of CXR's showed L > R infiltrates originally with slow improvement over the 1st 7-10 days.    Assessment/ Plan:   AKI on CKD 3a - b/l creat 1.6 from 2023, eGFR 47 ml/min.  Creat here was 2.3 on admission, nadir was at 1.6 and now uptrending is up to 3.7.  Renal US on admission showed no obstruction, UA was unremarkable. No hypotension while here, no IV contrast.  He has been diuresed for the last 8 days. With diuresis CCM notes that his O2 requirement improves. UOP is trending down tho; CCM giving albumin (started 8/12) with the lasix 120 q12. No indication for RRT at this time and not a great candidate for HD.  Patient is pos 10.7L during this hospitalization. Resp failure/ bilat infiltrates - per CXR's. Aspiration vs edema. IV lasix as above.  GNR bacteremia - on IV abx H/o cirrhosis / etoh abuse/ blind ICU delirium  Subjective:   Remain intubated, decent UOP with incr K   Objective:   BP 130/76   Pulse 81   Temp 98.9 F (37.2 C) (Axillary)   Resp (!) 24    Ht 6' 0.01" (1.829 m)   Wt 85.1 kg   SpO2 95%   BMI 25.44 kg/m   Intake/Output Summary (Last 24 hours) at 11/11/2022 6962 Last data filed at 11/11/2022 0800 Gross per 24 hour  Intake 1658.76 ml  Output 1170 ml  Net 488.76 ml   Weight change: 1.9 kg  Physical Exam: Gen on vent, sedated Chest clear anterior/ lateral RRR no RG Abd soft ntnd no mass or ascites +bs GU normal MS no joint effusions or deformity Ext no LE edema but 1+ UE edema, no wounds or ulcers Neuro is on vent, sedated  Imaging: DG Chest Port 1 View  Result Date: 11/11/2022 CLINICAL DATA:  Respiratory failure. EXAM: PORTABLE CHEST 1 VIEW COMPARISON:  11/09/2022 FINDINGS: ET tube tip is above the carina. There is a feeding tube with tip below the GE junction. Heart size is within normal limits. Persistent bilateral interstitial and airspace opacities which appear unchanged from previous exam. IMPRESSION: Persistent bilateral interstitial and airspace opacities. No significant interval change. Stable support apparatus. Electronically Signed   By: Signa Kell M.D.   On: 11/11/2022 08:29   ECHOCARDIOGRAM COMPLETE  Result Date: 11/10/2022    ECHOCARDIOGRAM REPORT   Patient Name:   Austin Berry Date of Exam: 11/10/2022 Medical Rec #:  [id number]       Height:       72.0 in  Accession #:    [id number]      Weight:       183.4 lb Date of Birth:  [birth year]      BSA:          2.054 m Patient Age:    56 years        BP:           158/94 mmHg Patient Gender: M               HR:           110 bpm. Exam Location:  Inpatient Procedure: 2D Echo, Color Doppler and Cardiac Doppler Indications:    Respiratory failure with hypercapnia  History:        Patient has no prior history of Echocardiogram examinations.                 Risk Factors:Diabetes, Hypertension and Current Smoker. Blind.  Sonographer:    Raeford Razor RDCS Referring Phys: [id number] PAULA B SIMPSON  Sonographer Comments: Image acquisition challenging due to respiratory motion.  IMPRESSIONS  1. Left ventricular ejection fraction, by estimation, is 60 to 65%. The left ventricle has normal function. The left ventricle has no regional wall motion abnormalities. There is severe concentric left ventricular hypertrophy. Left ventricular diastolic  parameters are indeterminate.  2. Right ventricular systolic function is normal. The right ventricular size is normal.  3. The mitral valve is normal in structure. Mild mitral valve regurgitation. No evidence of mitral stenosis.  4. The aortic valve is normal in structure. Aortic valve regurgitation is not visualized. No aortic stenosis is present.  5. The inferior vena cava is normal in size with greater than 50% respiratory variability, suggesting right atrial pressure of 3 mmHg. FINDINGS  Left Ventricle: Left ventricular ejection fraction, by estimation, is 60 to 65%. The left ventricle has normal function. The left ventricle has no regional wall motion abnormalities. The left ventricular internal cavity size was normal in size. There is  severe concentric left ventricular hypertrophy. Left ventricular diastolic parameters are indeterminate. Right Ventricle: The right ventricular size is normal. No increase in right ventricular wall thickness. Right ventricular systolic function is normal. Left Atrium: Left atrial size was normal in size. Right Atrium: Right atrial size was normal in size. Pericardium: There is no evidence of pericardial effusion. Mitral Valve: The mitral valve is normal in structure. Mild mitral valve regurgitation. No evidence of mitral valve stenosis. Tricuspid Valve: The tricuspid valve is normal in structure. Tricuspid valve regurgitation is not demonstrated. No evidence of tricuspid stenosis. Aortic Valve: The aortic valve is normal in structure. Aortic valve regurgitation is not visualized. No aortic stenosis is present. Aortic valve peak gradient measures 14.1 mmHg. Pulmonic Valve: The pulmonic valve was normal in structure.  Pulmonic valve regurgitation is not visualized. No evidence of pulmonic stenosis. Aorta: The aortic root is normal in size and structure. Venous: The inferior vena cava is normal in size with greater than 50% respiratory variability, suggesting right atrial pressure of 3 mmHg. IAS/Shunts: No atrial level shunt detected by color flow Doppler.  LEFT VENTRICLE PLAX 2D LVIDd:         4.00 cm   Diastology LVIDs:         2.70 cm   LV e' medial:   14.30 cm/s LV PW:         1.50 cm   LV E/e' medial: 8.2 LV IVS:        1.50 cm LVOT diam:  2.10 cm LV SV:         9 LV SV Index:   4 LVOT Area:     3.46 cm  RIGHT VENTRICLE          IVC RV Basal diam:  2.00 cm  IVC diam: 2.10 cm TAPSE (M-mode): 3.0 cm LEFT ATRIUM             Index        RIGHT ATRIUM           Index LA diam:        3.60 cm 1.75 cm/m   RA Area:     11.10 cm LA Vol (A2C):   66.5 ml 32.38 ml/m  RA Volume:   22.60 ml  11.00 ml/m LA Vol (A4C):   60.1 ml 29.27 ml/m LA Biplane Vol: 63.0 ml 30.68 ml/m  AORTIC VALVE AV Area (Vmax): 0.57 cm AV Vmax:        188.00 cm/s AV Peak Grad:   14.1 mmHg LVOT Vmax:      31.10 cm/s LVOT Vmean:     20.900 cm/s LVOT VTI:       0.025 m  AORTA Ao Root diam: 2.30 cm MITRAL VALVE MV Area (PHT): 6.17 cm     SHUNTS MV Decel Time: 123 msec     Systemic VTI:  0.03 m MV E velocity: 117.00 cm/s  Systemic Diam: 2.10 cm MV A velocity: 73.90 cm/s MV E/A ratio:  1.58 Kardie Tobb DO Electronically signed by Thomasene Ripple DO Signature Date/Time: 11/10/2022/5:40:18 PM    Final    DG Chest Port 1 View  Result Date: 11/09/2022 CLINICAL DATA:  Respiratory failure. EXAM: PORTABLE CHEST 1 VIEW COMPARISON:  One-view chest x-ray 11/05/2022 FINDINGS: The endotracheal tube terminates 4.2 cm of the carina. A small bore feeding tube courses off the inferior border the film. Right-sided PICC line is stable. The heart size is. Diffuse interstitial pattern has increased. Opacities are worse right than left. Moderate right pleural effusion is again noted.  Lung volumes remain low. IMPRESSION: 1. Stable support apparatus. 2. Increasing interstitial pattern and right pleural effusion compatible with congestive heart failure. Infection is not excluded. 3. Persistent low lung volumes. Electronically Signed   By: Marin Roberts M.D.   On: 11/09/2022 14:48    Labs: BMET Recent Labs  Lab 11/05/22 0338 11/06/22 0243 11/07/22 0212 11/07/22 1222 11/08/22 0305 11/09/22 0115 11/09/22 1946 11/10/22 0115 11/11/22 0302  NA 143 139 141 142 140 138 139 139 140  K 3.0* 3.8 3.8 4.7 4.7 5.2* 5.4* 5.4* 5.5*  CL 106 108 108  --  107 107 109 105 105  CO2 21* 20* 19*  --  15* 18* 17* 18* 19*  GLUCOSE 161* 198* 173*  --  182* 305* 212* 201* 260*  BUN 33* 37* 41*  --  55* 67* 86* 87* 109*  CREATININE 1.81* 1.66* 1.78*  --  2.32* 3.00* 3.50* 3.78* 4.58*  CALCIUM 9.8 9.2 9.0  --  9.0 9.6 9.7 9.9 9.6  PHOS 6.0* 6.5* 5.5*  --  6.6* 6.2*  --  6.6* 7.0*   CBC Recent Labs  Lab 11/08/22 0305 11/09/22 0115 11/10/22 0115 11/11/22 0302  WBC 9.5 13.8* 13.8* 12.9*  HGB 8.9* 9.2* 8.6* 8.6*  HCT 26.8* 28.0* 27.1* 27.1*  MCV 104.7* 104.5* 103.0* 103.0*  PLT 156 148* 161 183    Medications:     acetylcysteine  4 mL Nebulization TID   amLODipine  10  mg Per Tube Daily   Chlorhexidine Gluconate Cloth  6 each Topical Daily   famotidine  10 mg Per Tube Daily   feeding supplement (PROSource TF20)  60 mL Per Tube BID   folic acid  1 mg Per Tube Daily   gabapentin  300 mg Per Tube Q8H   heparin  5,000 Units Subcutaneous Q8H   hydrALAZINE  50 mg Per Tube Q8H   insulin aspart  0-20 Units Subcutaneous Q4H   insulin aspart  7 Units Subcutaneous Q4H   insulin detemir  20 Units Subcutaneous BID   mupirocin ointment  1 Application Nasal BID   mouth rinse  15 mL Mouth Rinse Q2H   oxyCODONE  5 mg Per Tube Q6H   polyethylene glycol  17 g Per Tube BID   QUEtiapine  150 mg Per Tube BID   sertraline  100 mg Per Tube Daily   sodium bicarbonate  100 mEq Intravenous  Once   sodium chloride flush  10-40 mL Intracatheter Q12H   thiamine  100 mg Per Tube Daily      Paulene Floor, MD 11/11/2022, 9:53 AM

## 2022-11-11 NOTE — Progress Notes (Signed)
eLink Physician-Brief Progress Note Patient Name: Austin Berry DOB: 01-27-1966 MRN: [id number]   Date of Service  11/11/2022  HPI/Events of Note  Unable to extubate due to thick secretions RN reports agitation despite PRN boluses  eICU Interventions  Add fentanyl gtt to Precedex     Intervention Category Minor Interventions: Agitation / anxiety - evaluation and management   [name] 11/11/2022, 8:33 PM

## 2022-11-11 NOTE — Procedures (Signed)
Bronchoscopy Procedure Note  Austin Berry  [id number]  May 14, 1965  Date:11/11/22  Time:12:25 PM   Provider Performing:    Procedure(s):  Flexible bronchoscopy with bronchial alveolar lavage (78295) and Initial Therapeutic Aspiration of Tracheobronchial Tree (62130)  Indication(s) Mucous plugging/acute respiratory failure  Consent Unable to obtain consent due to emergent nature of procedure.  Anesthesia Etomidate and rocuronium   Time Out Verified patient identification, verified procedure, site/side was marked, verified correct patient position, special equipment/implants available, medications/allergies/relevant history reviewed, required imaging and test results available.   Sterile Technique Usual hand hygiene, masks, gowns, and gloves were used   Procedure Description Bronchoscope advanced through endotracheal tube and into airway.  Airways were examined down to subsegmental level with findings noted below.   Following diagnostic evaluation, BAL(s) performed in right lower lobe with normal saline and return of greenish fluid and Therapeutic aspiration performed in throughout respiratory tree specially right lower lobe  Findings: Copious amount of tenacious secretions noted specially in right middle and lower lobe   Complications/Tolerance None; patient tolerated the procedure well. Chest X-ray is not needed post procedure.   EBL Minimal   Specimen(s) BAL

## 2022-11-11 NOTE — Plan of Care (Signed)
  Problem: Clinical Measurements: Goal: Ability to maintain clinical measurements within normal limits will improve Outcome: Not Progressing Goal: Will remain free from infection Outcome: Not Progressing Goal: Diagnostic test results will improve Outcome: Progressing Goal: Respiratory complications will improve Outcome: Progressing

## 2022-11-11 NOTE — Progress Notes (Signed)
Spoke with Arnette Felts [phone]) this afternoon she is Austin Berry daughter.  She called and was very concerned about the family meeting that was set up with daughter tomorrow.  She is stating that the patient isnt even documented on her birth certificate and knows nothing about the patient that she is just around for potential money if he passes.  She also states that the listed brother isnt really his brother that they were in foster care together.  She states that he has chosen her mother to be his person for decision making and that I could call his primary MD, social security, or dentist in [place] to verify.  Informed her that I understand her concerns and that the best way this could be handled is for them to provide [place] with health care power of attorney paperwork.  She stated she would look to see if she could find it at her mothers.  Informed her that I would inform the MD.    Dr Merrily Pew made aware and stated that he would consult social work in am.

## 2022-11-11 NOTE — Progress Notes (Signed)
Sputum sample taken down to lab at this time

## 2022-11-11 NOTE — Progress Notes (Signed)
eLink Physician-Brief Progress Note Patient Name: Austin Berry DOB: Jan 07, 1966 MRN: [id number]   Date of Service  11/11/2022  HPI/Events of Note  K 5.5 Increased Cr Volume overload  eICU Interventions  Hyperkalemia protocol with lasix (120 mg), albuterol, insulin, glucose Repeat BMET @9  AM     Intervention Category Minor Interventions: Electrolytes abnormality - evaluation and management   [name] 11/11/2022, 4:55 AM

## 2022-11-11 NOTE — TOC Progression Note (Signed)
Transition of Care [facility]) - Progression Note    Patient Details  Name: Austin Berry MRN: [id number] Date of Birth: 15-May-1965  [facility] CM/SW Contact  Tom-Johnson, Hershal Coria, RN Phone Number: 11/11/2022, 12:57 PM  Clinical Narrative:     Patient continues to be Intubated abd sedated. Continues on IV abx and Lasix and Sodium Bicarb. Creatinine continues to increase, today at 4.71. Nephrology following. Scheduled Bronchoscopy today 11/11/22.  Plan for a GOC meeting with family  tomorrow 11/12/22.   Patient not Medically ready for discharge. CM will continue to follow as patient progresses with care towards discharge.       Expected Discharge Plan and Services                                               Social Determinants of Health (SDOH) Interventions SDOH Screenings   Tobacco Use: High Risk (10/29/2022)    Readmission Risk Interventions    10/30/2022    1:17 PM  Readmission Risk Prevention Plan  Transportation Screening Complete  PCP or Specialist Appt within 3-5 Days Complete  HRI or Home Care Consult Complete  Social Work Consult for Recovery Care Planning/Counseling Complete  Palliative Care Screening Not Applicable  Medication Review Oceanographer) Referral to Pharmacy

## 2022-11-11 NOTE — Plan of Care (Signed)
  Problem: Education: Goal: Knowledge of General Education information will improve Description: Including pain rating scale, medication(s)/side effects and non-pharmacologic comfort measures Outcome: Not Progressing   Problem: Clinical Measurements: Goal: Ability to maintain clinical measurements within normal limits will improve Outcome: Not Progressing Goal: Will remain free from infection Outcome: Not Progressing Goal: Diagnostic test results will improve Outcome: Not Progressing Goal: Respiratory complications will improve Outcome: Not Progressing Goal: Cardiovascular complication will be avoided Outcome: Not Progressing   Problem: Nutrition: Goal: Adequate nutrition will be maintained Outcome: Not Progressing   Problem: Elimination: Goal: Will not experience complications related to bowel motility Outcome: Not Progressing Goal: Will not experience complications related to urinary retention Outcome: Not Progressing   Problem: Pain Managment: Goal: General experience of comfort will improve Outcome: Not Progressing   Problem: Safety: Goal: Ability to remain free from injury will improve Outcome: Not Progressing   Problem: Skin Integrity: Goal: Risk for impaired skin integrity will decrease Outcome: Not Progressing   Problem: Fluid Volume: Goal: Ability to maintain a balanced intake and output will improve Outcome: Not Progressing   Problem: Metabolic: Goal: Ability to maintain appropriate glucose levels will improve Outcome: Not Progressing   Problem: Nutritional: Goal: Maintenance of adequate nutrition will improve Outcome: Not Progressing   Problem: Skin Integrity: Goal: Risk for impaired skin integrity will decrease Outcome: Not Progressing   Problem: Tissue Perfusion: Goal: Adequacy of tissue perfusion will improve Outcome: Not Progressing   Problem: Safety: Goal: Non-violent Restraint(s) Outcome: Not Progressing
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
## 2022-11-12 ENCOUNTER — Inpatient Hospital Stay (HOSPITAL_COMMUNITY): Payer: Medicaid Other

## 2022-11-12 DIAGNOSIS — J80 Acute respiratory distress syndrome: Secondary | ICD-10-CM

## 2022-11-12 DIAGNOSIS — N179 Acute kidney failure, unspecified: Secondary | ICD-10-CM

## 2022-11-12 DIAGNOSIS — T783XXA Angioneurotic edema, initial encounter: Secondary | ICD-10-CM | POA: Diagnosis not present

## 2022-11-12 LAB — POCT I-STAT 7, (LYTES, BLD GAS, ICA,H+H)
Acid-base deficit: 4 mmol/L — ABNORMAL HIGH (ref 0.0–2.0)
Acid-base deficit: 3 mmol/L — ABNORMAL HIGH (ref 0.0–2.0)
Bicarbonate: 22.5 mmol/L (ref 20.0–28.0)
Bicarbonate: 22.9 mmol/L (ref 20.0–28.0)
Calcium, Ion: 1.22 mmol/L (ref 1.15–1.40)
Calcium, Ion: 1.21 mmol/L (ref 1.15–1.40)
HCT: 25 % — ABNORMAL LOW (ref 39.0–52.0)
HCT: 23 % — ABNORMAL LOW (ref 39.0–52.0)
Hemoglobin: 8.5 g/dL — ABNORMAL LOW (ref 13.0–17.0)
Hemoglobin: 7.8 g/dL — ABNORMAL LOW (ref 13.0–17.0)
O2 Saturation: 90 %
O2 Saturation: 94 %
Patient temperature: 37.2
Patient temperature: 97.7
Potassium: 5.3 mmol/L — ABNORMAL HIGH (ref 3.5–5.1)
Potassium: 5.5 mmol/L — ABNORMAL HIGH (ref 3.5–5.1)
Sodium: 142 mmol/L (ref 135–145)
Sodium: 142 mmol/L (ref 135–145)
TCO2: 24 mmol/L (ref 22–32)
TCO2: 24 mmol/L (ref 22–32)
pCO2 arterial: 44.5 mmHg (ref 32–48)
pCO2 arterial: 44.6 mmHg (ref 32–48)
pH, Arterial: 7.313 — ABNORMAL LOW (ref 7.35–7.45)
pH, Arterial: 7.316 — ABNORMAL LOW (ref 7.35–7.45)
pO2, Arterial: 64 mmHg — ABNORMAL LOW (ref 83–108)
pO2, Arterial: 75 mmHg — ABNORMAL LOW (ref 83–108)

## 2022-11-12 LAB — GLUCOSE, CAPILLARY
Glucose-Capillary: 188 mg/dL — ABNORMAL HIGH (ref 70–99)
Glucose-Capillary: 189 mg/dL — ABNORMAL HIGH (ref 70–99)
Glucose-Capillary: 197 mg/dL — ABNORMAL HIGH (ref 70–99)
Glucose-Capillary: 177 mg/dL — ABNORMAL HIGH (ref 70–99)
Glucose-Capillary: 216 mg/dL — ABNORMAL HIGH (ref 70–99)

## 2022-11-12 LAB — POTASSIUM
Potassium: 5.2 mmol/L — ABNORMAL HIGH (ref 3.5–5.1)
Potassium: 5.4 mmol/L — ABNORMAL HIGH (ref 3.5–5.1)

## 2022-11-12 MED ORDER — GABAPENTIN 250 MG/5ML PO SOLN
100.0000 mg | Freq: Two times a day (BID) | ORAL | Status: DC
  Administered 2022-11-12 (×2): 100 mg
  Filled 2022-11-12 (×3): qty 2

## 2022-11-12 MED ORDER — MIDAZOLAM HCL 2 MG/2ML IJ SOLN
2.0000 mg | Freq: Once | INTRAMUSCULAR | Status: AC
  Administered 2022-11-12: 2 mg via INTRAVENOUS
  Filled 2022-11-12: qty 2

## 2022-11-12 MED ORDER — SODIUM ZIRCONIUM CYCLOSILICATE 10 G PO PACK: 10.0000 g | PACK | Freq: Once | ORAL | Status: DC

## 2022-11-12 MED ORDER — FUROSEMIDE 10 MG/ML IJ SOLN
60.0000 mg | Freq: Once | INTRAMUSCULAR | Status: AC
  Administered 2022-11-13: 60 mg via INTRAVENOUS
  Filled 2022-11-12: qty 6

## 2022-11-12 MED ORDER — SODIUM ZIRCONIUM CYCLOSILICATE 10 G PO PACK
10.0000 g | PACK | Freq: Once | ORAL | Status: AC
  Administered 2022-11-12: 10 g
  Filled 2022-11-12: qty 1

## 2022-11-12 MED ORDER — INSULIN DETEMIR 100 UNIT/ML ~~LOC~~ SOLN
25.0000 [IU] | Freq: Two times a day (BID) | SUBCUTANEOUS | Status: DC
  Administered 2022-11-12 (×2): 25 [IU] via SUBCUTANEOUS
  Filled 2022-11-12 (×4): qty 0.25

## 2022-11-12 MED ORDER — SODIUM ZIRCONIUM CYCLOSILICATE 10 G PO PACK
10.0000 g | PACK | Freq: Every day | ORAL | Status: AC
  Administered 2022-11-12: 10 g
  Filled 2022-11-12: qty 1

## 2022-11-12 MED ORDER — FUROSEMIDE 10 MG/ML IJ SOLN
160.0000 mg | Freq: Once | INTRAVENOUS | Status: AC
  Administered 2022-11-12: 160 mg via INTRAVENOUS
  Filled 2022-11-12: qty 2

## 2022-11-12 MED ORDER — FUROSEMIDE 10 MG/ML IJ SOLN
120.0000 mg | Freq: Once | INTRAVENOUS | Status: DC
  Filled 2022-11-12: qty 12

## 2022-11-12 NOTE — Progress Notes (Signed)
[facility] Progress Note   57 y.o. year-old w/ PMH presented 10/29/22 to ED after having an allergic reaction to shellfish, taking acei for his HTN. He had hibachi shrimp and chicken and started swelling up during the meal. In ED he got epinephrine, then deteriorated w/ tongue swelling and stridor. Nasal intubation was attempted. EDP intubated w/ ketamine. He rec'd pepcid and solumedrol. CXR showed L sided infiltrates and IV abx were started for possible CAP. Baseline creat was 1.7- 2. Pt arrive to hospital w/ creat 2.4. Pt required phenobarb taper for etoh withdrawal on 8/1. On 8/02 was extubated but had to be re-intubated due to encephalopathy, blood cx's were + from 7/30 for Pantoea agglomerans and this is unusual and thought to be related to aspiration pna, so IV abx were given. Creat improved from 2.4 on 7/30 admission down to 1.6 on 8/07. However, since then creat has increased up to 3.78 today.  CVP is 9 today. Review of CXR's showed L > R infiltrates originally with slow improvement over the 1st 7-10 days.    Assessment/ Plan:   AKI on CKD 3a - b/l creat 1.6 from 2023, eGFR 47 ml/min.  Creat here was 2.3 on admission, nadir was at 1.6 and now uptrending is up to 3.7.  Renal US on admission showed no obstruction, UA was unremarkable. No hypotension while here, no IV contrast.  He has been diuresed for the last 8 days. With diuresis CCM notes that his O2 requirement improves. UOP is trending down tho; CCM giving albumin (started 8/12) with the lasix 120 q12. No indication for RRT at this time and not a great candidate for HD.  Patient is pos 12.8L during this hospitalization. - CCM to have GOC meeting with family; he is not a good candidate for renal replacement therapy and I agree with CCM that it's better not to start. - Will follow closely with you. Resp failure/ bilat infiltrates - per CXR's. Aspiration vs edema. IV lasix as above.   A lot of secretions (greenish) on BAL right  mid and lower lung on 8/12) GNR bacteremia - on IV abx H/o cirrhosis / etoh abuse/ blind ICU delirium  Subjective:   Remain intubated, UOP trending in wrong direction with incr K   Objective:   BP 101/65   Pulse 70   Temp 98 F (36.7 C) (Oral)   Resp 18   Ht 6' 0.01" (1.829 m)   Wt 85.9 kg   SpO2 99%   BMI 25.68 kg/m   Intake/Output Summary (Last 24 hours) at 11/12/2022 6440 Last data filed at 11/12/2022 0600 Gross per 24 hour  Intake 2254.5 ml  Output 370 ml  Net 1884.5 ml   Weight change: 0.8 kg  Physical Exam: Gen on vent, sedated Chest clear anterior/ lateral RRR no RG Abd soft ntnd no mass or ascites +bs GU normal MS no joint effusions or deformity Ext no LE edema but 1+ UE edema, no wounds or ulcers Neuro is on vent, sedated  Imaging: DG Chest Port 1 View  Result Date: 11/11/2022 CLINICAL DATA:  Respiratory failure. EXAM: PORTABLE CHEST 1 VIEW COMPARISON:  11/09/2022 FINDINGS: ET tube tip is above the carina. There is a feeding tube with tip below the GE junction. Heart size is within normal limits. Persistent bilateral interstitial and airspace opacities which appear unchanged from previous exam. IMPRESSION: Persistent bilateral interstitial and airspace opacities. No significant interval change. Stable support apparatus. Electronically Signed   By: Signa Kell  M.D.   On: 11/11/2022 08:29   ECHOCARDIOGRAM COMPLETE  Result Date: 11/10/2022    ECHOCARDIOGRAM REPORT   Patient Name:   Austin Berry Date of Exam: 11/10/2022 Medical Rec #:  [id number]       Height:       72.0 in Accession #:    [id number]      Weight:       183.4 lb Date of Birth:  1966/01/12      BSA:          2.054 m Patient Age:    56 years        BP:           158/94 mmHg Patient Gender: M               HR:           110 bpm. Exam Location:  Inpatient Procedure: 2D Echo, Color Doppler and Cardiac Doppler Indications:    Respiratory failure with hypercapnia  History:        Patient has no prior  history of Echocardiogram examinations.                 Risk Factors:Diabetes, Hypertension and Current Smoker. Blind.  Sonographer:    Raeford Razor RDCS Referring Phys: [phone] PAULA B SIMPSON  Sonographer Comments: Image acquisition challenging due to respiratory motion. IMPRESSIONS  1. Left ventricular ejection fraction, by estimation, is 60 to 65%. The left ventricle has normal function. The left ventricle has no regional wall motion abnormalities. There is severe concentric left ventricular hypertrophy. Left ventricular diastolic  parameters are indeterminate.  2. Right ventricular systolic function is normal. The right ventricular size is normal.  3. The mitral valve is normal in structure. Mild mitral valve regurgitation. No evidence of mitral stenosis.  4. The aortic valve is normal in structure. Aortic valve regurgitation is not visualized. No aortic stenosis is present.  5. The inferior vena cava is normal in size with greater than 50% respiratory variability, suggesting right atrial pressure of 3 mmHg. FINDINGS  Left Ventricle: Left ventricular ejection fraction, by estimation, is 60 to 65%. The left ventricle has normal function. The left ventricle has no regional wall motion abnormalities. The left ventricular internal cavity size was normal in size. There is  severe concentric left ventricular hypertrophy. Left ventricular diastolic parameters are indeterminate. Right Ventricle: The right ventricular size is normal. No increase in right ventricular wall thickness. Right ventricular systolic function is normal. Left Atrium: Left atrial size was normal in size. Right Atrium: Right atrial size was normal in size. Pericardium: There is no evidence of pericardial effusion. Mitral Valve: The mitral valve is normal in structure. Mild mitral valve regurgitation. No evidence of mitral valve stenosis. Tricuspid Valve: The tricuspid valve is normal in structure. Tricuspid valve regurgitation is not demonstrated. No  evidence of tricuspid stenosis. Aortic Valve: The aortic valve is normal in structure. Aortic valve regurgitation is not visualized. No aortic stenosis is present. Aortic valve peak gradient measures 14.1 mmHg. Pulmonic Valve: The pulmonic valve was normal in structure. Pulmonic valve regurgitation is not visualized. No evidence of pulmonic stenosis. Aorta: The aortic root is normal in size and structure. Venous: The inferior vena cava is normal in size with greater than 50% respiratory variability, suggesting right atrial pressure of 3 mmHg. IAS/Shunts: No atrial level shunt detected by color flow Doppler.  LEFT VENTRICLE PLAX 2D LVIDd:         4.00 cm  Diastology LVIDs:         2.70 cm   LV e' medial:   14.30 cm/s LV PW:         1.50 cm   LV E/e' medial: 8.2 LV IVS:        1.50 cm LVOT diam:     2.10 cm LV SV:         9 LV SV Index:   4 LVOT Area:     3.46 cm  RIGHT VENTRICLE          IVC RV Basal diam:  2.00 cm  IVC diam: 2.10 cm TAPSE (M-mode): 3.0 cm LEFT ATRIUM             Index        RIGHT ATRIUM           Index LA diam:        3.60 cm 1.75 cm/m   RA Area:     11.10 cm LA Vol (A2C):   66.5 ml 32.38 ml/m  RA Volume:   22.60 ml  11.00 ml/m LA Vol (A4C):   60.1 ml 29.27 ml/m LA Biplane Vol: 63.0 ml 30.68 ml/m  AORTIC VALVE AV Area (Vmax): 0.57 cm AV Vmax:        188.00 cm/s AV Peak Grad:   14.1 mmHg LVOT Vmax:      31.10 cm/s LVOT Vmean:     20.900 cm/s LVOT VTI:       0.025 m  AORTA Ao Root diam: 2.30 cm MITRAL VALVE MV Area (PHT): 6.17 cm     SHUNTS MV Decel Time: 123 msec     Systemic VTI:  0.03 m MV E velocity: 117.00 cm/s  Systemic Diam: 2.10 cm MV A velocity: 73.90 cm/s MV E/A ratio:  1.58 Kardie Tobb DO Electronically signed by Thomasene Ripple DO Signature Date/Time: 11/10/2022/5:40:18 PM    Final     Labs: BMET Recent Labs  Lab 11/06/22 0243 11/07/22 5409 11/07/22 1222 11/08/22 0305 11/09/22 0115 11/09/22 1946 11/10/22 0115 11/11/22 0302 11/11/22 8119 11/11/22 1508 11/11/22 1937  11/11/22 2001 11/12/22 0109 11/12/22 0331 11/12/22 0404  NA 139 141   < > 140 138 139 139 140 139 141 142 142 142  143 142  K 3.8 3.8   < > 4.7 5.2* 5.4* 5.4* 5.5* 5.3* 5.0 5.2* 5.1 5.3* 5.3* 5.5*  CL 108 108  --  107 107 109 105 105 103  --   --   --   --  105  --   CO2 20* 19*  --  15* 18* 17* 18* 19* 19*  --   --   --   --  22  --   GLUCOSE 198* 173*  --  182* 305* 212* 201* 260* 267*  --   --   --   --  213*  --   BUN 37* 41*  --  55* 67* 86* 87* 109* 116*  --   --   --   --  130*  --   CREATININE 1.66* 1.78*  --  2.32* 3.00* 3.50* 3.78* 4.58* 4.71*  --   --   --   --  5.38*  --   CALCIUM 9.2 9.0  --  9.0 9.6 9.7 9.9 9.6 9.6  --   --   --   --  9.4  --   PHOS 6.5* 5.5*  --  6.6* 6.2*  --  6.6* 7.0*  --   --   --   --   --  7.9*  --    < > = values in this interval not displayed.   CBC Recent Labs  Lab 11/09/22 0115 11/10/22 0115 11/11/22 0302 11/11/22 1508 11/11/22 2001 11/12/22 0109 11/12/22 0331 11/12/22 0404  WBC 13.8* 13.8* 12.9*  --   --   --  12.4*  --   HGB 9.2* 8.6* 8.6*   < > 8.8* 8.5* 8.1* 7.8*  HCT 28.0* 27.1* 27.1*   < > 26.0* 25.0* 25.8* 23.0*  MCV 104.5* 103.0* 103.0*  --   --   --  102.4*  --   PLT 148* 161 183  --   --   --  192  --    < > = values in this interval not displayed.    Medications:     acetylcysteine  4 mL Nebulization TID   amLODipine  10 mg Per Tube Daily   Chlorhexidine Gluconate Cloth  6 each Topical Daily   docusate  100 mg Per Tube BID   famotidine  10 mg Per Tube Daily   feeding supplement (PROSource TF20)  60 mL Per Tube BID   folic acid  1 mg Per Tube Daily   gabapentin  100 mg Per Tube Q12H   heparin  5,000 Units Subcutaneous Q8H   hydrALAZINE  50 mg Per Tube Q8H   insulin aspart  0-20 Units Subcutaneous Q4H   insulin aspart  7 Units Subcutaneous Q4H   insulin detemir  25 Units Subcutaneous BID   mouth rinse  15 mL Mouth Rinse Q2H   oxyCODONE  5 mg Per Tube Q6H   polyethylene glycol  17 g Per Tube BID   QUEtiapine  150 mg  Per Tube BID   sertraline  100 mg Per Tube Daily   sodium chloride flush  10-40 mL Intracatheter Q12H   thiamine  100 mg Per Tube Daily      Paulene Floor, MD 11/12/2022, 8:53 AM

## 2022-11-12 NOTE — TOC Progression Note (Signed)
Transition of Care [facility]) - Initial/Assessment Note    Patient Details  Name: Austin Berry MRN: [id number] Date of Birth: 12-20-1965  Transition of Care [facility]) CM/SW Contact:    Ralene Bathe, LCSW Phone Number: 11/12/2022, 2:24 PM  Clinical Narrative:                 LCSW contacted the patient's brother, Dannielle Huh, as MD will need to have a meeting to discuss GOC.  The brother is available to meet tomorrow at 10am tomorrow 8/14.  LCSW contacted the patient's significant other, Adela Lank.  The significant other is also available tomorrow at 10am.  LCSW called the patient's daughter, Amil Amen, 5 times and there was no answer.  LCSW was unable to leave a message due to the VM box being full.  MD confirmed availability to meet with family on 8/14 at 10am.    TOC following.         Patient Goals and CMS Choice            Expected Discharge Plan and Services                                              Prior Living Arrangements/Services                       Activities of Daily Living      Permission Sought/Granted                  Emotional Assessment              Admission diagnosis:  Angioedema [T78.3XXA] Patient Active Problem List   Diagnosis Date Noted   Acute kidney injury (HCC) 11/12/2022   Acute respiratory failure with hypoxia (HCC) 11/08/2022   Angioedema 10/29/2022   Hyperkalemia 08/17/2021   Diabetes mellitus without complication (HCC) 08/17/2021   Blindness of both eyes 08/17/2021   Elevated LFTs 08/17/2021   Elevated ferritin 06/21/2020   Chronic follicular conjunctivitis of left eye 02/08/2020   Exposure keratopathy, right 02/08/2020   Lagophthalmos, paralytic, right 02/08/2020   Senile ectropion of left lower eyelid 02/08/2020   Absolute glaucoma of left eye 03/20/2016   Primary open angle glaucoma of right eye, severe stage 03/20/2016   Alcohol abuse 08/04/2014   Cocaine abuse (HCC) 08/04/2014   Blindness, acquired  08/04/2014   Hypertension 08/04/2014   PTSD (post-traumatic stress disorder) 08/04/2014   PCP:  Benetta Spar, MD Pharmacy:   [facility] [phone] - [place], [place] - [address] AT [facility] [address] [place] [address] Phone: [phone] Fax: [phone]  [facility] - [place], [place] - [address] [place] [address] Phone: [phone] Fax: [phone]     Social Determinants of Health (SDOH) Social History: SDOH Screenings   Tobacco Use: High Risk (10/29/2022)   SDOH Interventions: Transportation Interventions: Intervention Not Indicated, Inpatient TOC, Patient Resources (Friends/Family)   Readmission Risk Interventions    10/30/2022    1:17 PM  Readmission Risk Prevention Plan  Transportation Screening Complete  PCP or Specialist Appt within 3-5 Days Complete  HRI or Home Care Consult Complete  Social Work Consult for Recovery Care Planning/Counseling Complete  Palliative Care Screening Not Applicable  Medication Review Oceanographer) Referral to Pharmacy

## 2022-11-12 NOTE — Progress Notes (Addendum)
eLink Physician-Brief Progress Note Patient Name: Austin Berry DOB: 04-01-1966 MRN: [id number]   Date of Service  11/12/2022  HPI/Events of Note  Remains on FIO100% PEEP 10 with similar PO2 on ABG  eICU Interventions  Trial PEEP 12 ABG in 2 hours   ABG reviewed PO2 slightly improved from 64>75. No vent changes. Wean FIO2 as above CXR this am with similar bibbasilar infiltrates, no new effusion K 5.3 in setting of worsening renal failure. Lokelma daily repeated. Recheck in 4 hours  Intervention Category Intermediate Interventions: Respiratory distress - evaluation and management   [name] 11/12/2022, 1:52 AM

## 2022-11-12 NOTE — Progress Notes (Addendum)
eLink Physician-Brief Progress Note Patient Name: RICKEY WACHSMUTH DOB: 05-08-1965 MRN: [id number]   Date of Service  11/12/2022  HPI/Events of Note  RN requested restraint renewal and order for Flexiseal  eICU Interventions  Orders placed     Intervention Category Intermediate Interventions: Other:  Oretha Milch 11/12/2022, 8:00 PM  Addendum at 11:05 pm - Called for desaturation. Was on 60%/peep 12 when noted to have lower o2 sat. RN/RT boosted him up but o2 sat continued to drop and dropped to 76% and we were called. When I saw him on camera he was being bagged but O2 sat did not change. Suction cannula going in smoothly, has bilateral breath sounds, is sedated well in no distress and was easy to bag per bedside team. CXR ordered. Placed back on vent with 100% fio2 and peep 12. O2 sat up to 85-86 but labile. ABG to be done as well. RT giving nebs. Of note he was on 100% fio2 last night as well . On 60%, his o2 sat was around 88-89. Has has ongoing ARDS and has been deemed not fit for HD per renal as well.   Addendum at 11:45 pm - O2 sat is 90 at this time but patient very uncomfortable and awake on vent. Is on 200 mic/hour fentanyl and 1 mic of precedex. Asked RN to increase fentanyl, give 2 mg versed push and also give another mg of lasix IV (AM dose did not work). Recheck ABG at 3 am. ABG has been done but not in epic at this time. Check bmp as well. Overall poor prognosis and family meeting planned tomorrow   Addendum at 12:45 am - O2 sat is 85/86 at best. Currently appears comfortable after adjusting sedation. Add a versed drip and start paralytics (ordering intermittent first to see how he does once RASS is achieved). Get art line . Trial steroids . If no improvement in an hour or so or if sat drops further then will need to prone. Dw RN
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
## 2022-11-13 ENCOUNTER — Inpatient Hospital Stay (HOSPITAL_COMMUNITY): Payer: Medicaid Other

## 2022-11-13 DIAGNOSIS — T783XXA Angioneurotic edema, initial encounter: Secondary | ICD-10-CM | POA: Diagnosis not present

## 2022-11-13 DIAGNOSIS — U071 COVID-19: Secondary | ICD-10-CM | POA: Diagnosis not present

## 2022-11-13 DIAGNOSIS — J189 Pneumonia, unspecified organism: Secondary | ICD-10-CM

## 2022-11-13 DIAGNOSIS — N179 Acute kidney failure, unspecified: Secondary | ICD-10-CM | POA: Diagnosis not present

## 2022-11-13 DIAGNOSIS — J80 Acute respiratory distress syndrome: Secondary | ICD-10-CM

## 2022-11-13 LAB — POCT I-STAT 7, (LYTES, BLD GAS, ICA,H+H)
Acid-base deficit: 3 mmol/L — ABNORMAL HIGH (ref 0.0–2.0)
Acid-base deficit: 2 mmol/L (ref 0.0–2.0)
Bicarbonate: 22.9 mmol/L (ref 20.0–28.0)
Bicarbonate: 22.9 mmol/L (ref 20.0–28.0)
Calcium, Ion: 1.2 mmol/L (ref 1.15–1.40)
Calcium, Ion: 1.19 mmol/L (ref 1.15–1.40)
HCT: 25 % — ABNORMAL LOW (ref 39.0–52.0)
HCT: 26 % — ABNORMAL LOW (ref 39.0–52.0)
Hemoglobin: 8.5 g/dL — ABNORMAL LOW (ref 13.0–17.0)
Hemoglobin: 8.8 g/dL — ABNORMAL LOW (ref 13.0–17.0)
O2 Saturation: 95 %
O2 Saturation: 94 %
Patient temperature: 98.5
Patient temperature: 97.5
Potassium: 4.8 mmol/L (ref 3.5–5.1)
Potassium: 5 mmol/L (ref 3.5–5.1)
Sodium: 143 mmol/L (ref 135–145)
Sodium: 144 mmol/L (ref 135–145)
TCO2: 24 mmol/L (ref 22–32)
TCO2: 24 mmol/L (ref 22–32)
pCO2 arterial: 41.4 mmHg (ref 32–48)
pCO2 arterial: 36.5 mmHg (ref 32–48)
pH, Arterial: 7.35 (ref 7.35–7.45)
pH, Arterial: 7.403 (ref 7.35–7.45)
pO2, Arterial: 81 mmHg — ABNORMAL LOW (ref 83–108)
pO2, Arterial: 68 mmHg — ABNORMAL LOW (ref 83–108)

## 2022-11-13 LAB — GLUCOSE, CAPILLARY
Glucose-Capillary: 112 mg/dL — ABNORMAL HIGH (ref 70–99)
Glucose-Capillary: 153 mg/dL — ABNORMAL HIGH (ref 70–99)

## 2022-11-13 MED ORDER — HYDRALAZINE HCL 20 MG/ML IJ SOLN
10.0000 mg | INTRAMUSCULAR | Status: DC | PRN
  Administered 2022-11-13: 10 mg via INTRAVENOUS
  Filled 2022-11-13: qty 1

## 2022-11-13 MED ORDER — MIDAZOLAM BOLUS VIA INFUSION
1.0000 mg | INTRAVENOUS | Status: DC | PRN
  Administered 2022-11-13 (×3): 2 mg via INTRAVENOUS

## 2022-11-13 MED ORDER — ACETAMINOPHEN 650 MG RE SUPP: 650.0000 mg | Freq: Four times a day (QID) | RECTAL | Status: DC | PRN

## 2022-11-13 MED ORDER — NOREPINEPHRINE 4 MG/250ML-% IV SOLN
2.0000 ug/min | INTRAVENOUS | Status: DC
  Administered 2022-11-13: 3 ug/min via INTRAVENOUS

## 2022-11-13 MED ORDER — SODIUM CHLORIDE 0.9 % IV SOLN: INTRAVENOUS | Status: DC

## 2022-11-13 MED ORDER — CISATRACURIUM BESYLATE 20 MG/10ML IV SOLN
0.1000 mg/kg | INTRAVENOUS | Status: DC | PRN
  Administered 2022-11-13: 8.6 mg via INTRAVENOUS
  Filled 2022-11-13: qty 10

## 2022-11-13 MED ORDER — MIDAZOLAM-SODIUM CHLORIDE 100-0.9 MG/100ML-% IV SOLN
2.0000 mg/h | INTRAVENOUS | Status: DC
  Administered 2022-11-13: 2 mg/h via INTRAVENOUS
  Administered 2022-11-13: 10 mg/h via INTRAVENOUS
  Filled 2022-11-13 (×2): qty 100

## 2022-11-13 MED ORDER — SODIUM CHLORIDE 0.9 % IV SOLN: 250.0000 mL | INTRAVENOUS | Status: DC

## 2022-11-13 MED ORDER — NOREPINEPHRINE 4 MG/250ML-% IV SOLN
INTRAVENOUS | Status: AC
  Administered 2022-11-13: 4 mg
  Filled 2022-11-13: qty 250

## 2022-11-13 MED ORDER — FENTANYL 2500MCG IN NS 250ML (10MCG/ML) PREMIX INFUSION
50.0000 ug/h | INTRAVENOUS | Status: DC
  Administered 2022-11-13 (×2): 300 ug/h via INTRAVENOUS
  Filled 2022-11-13: qty 250

## 2022-11-13 MED ORDER — ONDANSETRON HCL 4 MG/2ML IJ SOLN: 4.0000 mg | Freq: Four times a day (QID) | INTRAMUSCULAR | Status: DC | PRN

## 2022-11-13 MED ORDER — GLYCOPYRROLATE 1 MG PO TABS: 1.0000 mg | ORAL_TABLET | ORAL | Status: DC | PRN

## 2022-11-13 MED ORDER — DEXAMETHASONE SODIUM PHOSPHATE 10 MG/ML IJ SOLN
10.0000 mg | Freq: Once | INTRAMUSCULAR | Status: AC
  Administered 2022-11-13: 10 mg via INTRAVENOUS
  Filled 2022-11-13: qty 1

## 2022-11-13 MED ORDER — FUROSEMIDE 10 MG/ML IJ SOLN
160.0000 mg | Freq: Two times a day (BID) | INTRAVENOUS | Status: DC
  Filled 2022-11-13 (×2): qty 16

## 2022-11-13 MED ORDER — GLYCOPYRROLATE 0.2 MG/ML IJ SOLN: 0.2000 mg | INTRAMUSCULAR | Status: DC | PRN

## 2022-11-13 MED ORDER — SODIUM CHLORIDE 0.9 % IV SOLN
0.5000 ug/kg/min | INTRAVENOUS | Status: DC
  Administered 2022-11-13: 3 ug/kg/min via INTRAVENOUS
  Filled 2022-11-13 (×3): qty 20

## 2022-11-13 MED ORDER — ONDANSETRON 4 MG PO TBDP
4.0000 mg | ORAL_TABLET | Freq: Four times a day (QID) | ORAL | Status: DC | PRN
  Filled 2022-11-13: qty 1

## 2022-11-13 MED ORDER — ARTIFICIAL TEARS OPHTHALMIC OINT
1.0000 | TOPICAL_OINTMENT | Freq: Three times a day (TID) | OPHTHALMIC | Status: DC
  Administered 2022-11-13 (×2): 1 via OPHTHALMIC
  Filled 2022-11-13: qty 3.5

## 2022-11-13 MED ORDER — POLYVINYL ALCOHOL 1.4 % OP SOLN
1.0000 [drp] | Freq: Four times a day (QID) | OPHTHALMIC | Status: DC | PRN
  Filled 2022-11-13: qty 15

## 2022-11-13 MED ORDER — ACETAMINOPHEN 325 MG PO TABS: 650.0000 mg | ORAL_TABLET | Freq: Four times a day (QID) | ORAL | Status: DC | PRN

## 2022-11-22 ENCOUNTER — Encounter (HOSPITAL_COMMUNITY): Payer: Self-pay | Admitting: Internal Medicine
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
## 2022-12-01 NOTE — Progress Notes (Addendum)
ABG from 11/12/22 at 23:21:  pH 7.276 pCO2 49.8 pO2 65 HCO3 23.2 sO2% 89  Reported to [facility] MD.

## 2022-12-01 NOTE — Progress Notes (Signed)
[facility] Progress Note   57 y.o. year-old w/ PMH presented 10/29/22 to ED after having an allergic reaction to shellfish, taking acei for his HTN. He had hibachi shrimp and chicken and started swelling up during the meal. In ED he got epinephrine, then deteriorated w/ tongue swelling and stridor. Nasal intubation was attempted. EDP intubated w/ ketamine. He rec'd pepcid and solumedrol. CXR showed L sided infiltrates and IV abx were started for possible CAP. Baseline creat was 1.7- 2. Pt arrive to hospital w/ creat 2.4. Pt required phenobarb taper for etoh withdrawal on 8/1. On 8/02 was extubated but had to be re-intubated due to encephalopathy, blood cx's were + from 7/30 for [employer] agglomerans and this is unusual and thought to be related to aspiration pna, so IV abx were given. Creat improved from 2.4 on 7/30 admission down to 1.6 on 8/07. However, since then creat has increased up to 3.78 today.  CVP is 9 today. Review of CXR's showed L > R infiltrates originally with slow improvement over the 1st 7-10 days.    Assessment/ Plan:   AKI on CKD 3a - b/l creat 1.6 from 2023, eGFR 47 ml/min.  Creat here was 2.3 on admission, nadir was at 1.6 and now uptrending is up to 3.7.  Renal US on admission showed no obstruction, UA was unremarkable. No hypotension while here, no IV contrast.  He has been diuresed for the last 8 days. With diuresis CCM notes that his O2 requirement improves. UOP is trending down tho; CCM giving albumin (started 8/12) with the lasix 120 q12. No indication for RRT at this time and not a great candidate for HD.  Patient is pos 12.8L during this hospitalization. - CCM to have GOC meeting with family; he is not a good candidate for renal replacement therapy and I agree with CCM that it's better not to start. - Will follow closely in case things change; still headed the wrong direction from with very poor response to high dose Lasix and difficulty oxygenating now in the  prone position. Resp failure/ bilat infiltrates - per CXR's. Aspiration vs edema. IV lasix as above.   A lot of secretions (greenish) on BAL right mid and lower lung on 8/12) GNR bacteremia - on IV abx H/o cirrhosis / etoh abuse/ blind ICU delirium  Subjective:   Remain intubated with difficulty oxygenating in prone position over night on 8/13. UOP trending in wrong direction with poor response to high dose lasix.    Objective:   BP (!) 184/89   Pulse 76   Temp (!) 97.5 F (36.4 C) (Oral)   Resp (!) 0   Ht 6' 0.01" (1.829 m)   Wt 85.4 kg   SpO2 99%   BMI 25.53 kg/m   Intake/Output Summary (Last 24 hours) at 11/03/2022 0930 Last data filed at 11/27/2022 0900 Gross per 24 hour  Intake 3101.08 ml  Output 1712 ml  Net 1389.08 ml   Weight change: -0.5 kg  Physical Exam: Gen on vent, sedated Chest clear anterior/ lateral RRR no RG Abd soft ntnd no mass or ascites +bs GU normal MS no joint effusions or deformity Ext no LE edema but 1+ UE edema, no wounds or ulcers Neuro is on vent, sedated  Imaging: DG Chest Port 1 View  Result Date: 11/21/2022 CLINICAL DATA:  Check endotracheal tube placement EXAM: PORTABLE CHEST 1 VIEW COMPARISON:  11/12/2022 FINDINGS: Endotracheal tube, feeding catheter and right PICC are seen in satisfactory position. Cardiac shadow  is stable. Diffuse increased central vascular congestion is noted likely related to change in patient positioning. No focal confluent infiltrate is seen. No bony abnormality is noted. IMPRESSION: Increased central vascular congestion likely related to the change in positioning. No focal confluent infiltrate is seen. Tubes and lines in satisfactory position. Electronically Signed   By: Alcide Clever M.D.   On: 11/15/2022 03:40   DG Chest Port 1 View  Result Date: 11/12/2022 CLINICAL DATA:  Respiratory distress. Evaluate endotracheal tube. Airspace opacity. Bilateral pleural effusion. EXAM: PORTABLE CHEST 1 VIEW COMPARISON:   11/12/2022 FINDINGS: Endotracheal tube present with tip measuring 4.1 cm above the carina. Enteric tube tip is off the field of view but below the left hemidiaphragm. Right central venous catheter with tip over the low SVC region. No pneumothorax. Cardiac enlargement with pulmonary vascular congestion and perihilar infiltration, likely edema or possibly pneumonia. No progression since previous study. Small bilateral pleural effusions. IMPRESSION: Cardiac enlargement with pulmonary vascular congestion and perihilar infiltration similar to prior study. Small bilateral pleural effusions. Appliances are in satisfactory position. Electronically Signed   By: Burman Nieves M.D.   On: 11/12/2022 23:23   DG CHEST PORT 1 VIEW  Result Date: 11/12/2022 CLINICAL DATA:  Pneumonia EXAM: PORTABLE CHEST 1 VIEW COMPARISON:  11/11/2022 FINDINGS: No significant change in AP portable chest radiograph. Heart and mediastinum normal. Endotracheal tube, enteric feeding tube, and right upper extremity PICC unchanged. Heterogeneous and interstitial airspace opacity throughout the lungs, unchanged. Small, layering bilateral pleural effusions. Osseous structures unremarkable. IMPRESSION: 1. No significant change in AP portable chest radiograph. 2. Heterogeneous and interstitial airspace opacity throughout the lungs, unchanged. Small, layering bilateral pleural effusions. 3. Support apparatus unchanged. Electronically Signed   By: Jearld Lesch M.D.   On: 11/12/2022 09:46    Labs: [employer] Recent Labs  Lab 11/07/22 0212 11/07/22 1222 11/08/22 0305 11/09/22 0115 11/09/22 1946 11/10/22 0115 11/11/22 0302 11/11/22 0858 11/11/22 1508 11/12/22 0331 11/12/22 0404 11/12/22 1256 11/12/22 1614 11/12/22 2321 11/15/2022 0009 11/14/2022 0209 11/11/2022 0424 11/03/2022 0426  NA 141   < > 140 138 139 139 140 139   < > 143 142  --   --  143 144 143 145 144  K 3.8   < > 4.7 5.2* 5.4* 5.4* 5.5* 5.3*   < > 5.3* 5.5* 5.2* 5.4* 5.2* 4.4 4.8 5.0  5.0  CL 108  --  107 107 109 105 105 103  --  105  --   --   --   --  109  --  105  --   CO2 19*  --  15* 18* 17* 18* 19* 19*  --  22  --   --   --   --  19*  --  21*  --   GLUCOSE 173*  --  182* 305* 212* 201* 260* 267*  --  213*  --   --   --   --  166*  --  131*  --   BUN 41*  --  55* 67* 86* 87* 109* 116*  --  130*  --   --   --   --  133*  --  146*  --   CREATININE 1.78*  --  2.32* 3.00* 3.50* 3.78* 4.58* 4.71*  --  5.38*  --   --   --   --  5.21*  --  5.73*  --   CALCIUM 9.0  --  9.0 9.6 9.7 9.9 9.6 9.6  --  9.4  --   --   --   --  8.8*  --  9.9  --   PHOS 5.5*  --  6.6* 6.2*  --  6.6* 7.0*  --   --  7.9*  --   --   --   --   --   --   --   --    < > = values in this interval not displayed.   CBC Recent Labs  Lab 11/10/22 0115 11/11/22 0302 11/11/22 1508 11/12/22 0331 11/12/22 0404 11/12/22 2321 11/23/2022 0209 11/21/2022 0424 11/09/2022 0426  WBC 13.8* 12.9*  --  12.4*  --   --   --  15.5*  --   HGB 8.6* 8.6*   < > 8.1*   < > 9.9* 8.5* 8.8* 8.8*  HCT 27.1* 27.1*   < > 25.8*   < > 29.0* 25.0* 26.6* 26.0*  MCV 103.0* 103.0*  --  102.4*  --   --   --  103.1*  --   PLT 161 183  --  192  --   --   --  218  --    < > = values in this interval not displayed.    Medications:     acetylcysteine  4 mL Nebulization TID   amLODipine  10 mg Per Tube Daily   artificial tears  1 Application Both Eyes Q8H   Chlorhexidine Gluconate Cloth  6 each Topical Daily   docusate  100 mg Per Tube BID   famotidine  10 mg Per Tube Daily   feeding supplement (PROSource TF20)  60 mL Per Tube BID   folic acid  1 mg Per Tube Daily   gabapentin  100 mg Per Tube Q12H   heparin  5,000 Units Subcutaneous Q8H   insulin aspart  0-20 Units Subcutaneous Q4H   insulin detemir  25 Units Subcutaneous BID   mouth rinse  15 mL Mouth Rinse Q2H   oxyCODONE  5 mg Per Tube Q6H   polyethylene glycol  17 g Per Tube BID   QUEtiapine  150 mg Per Tube BID   sertraline  100 mg Per Tube Daily   sodium chloride flush  10-40  mL Intracatheter Q12H   thiamine  100 mg Per Tube Daily      Paulene Floor, MD 11/24/2022, 9:30 AM

## 2022-12-01 NOTE — IPAL (Signed)
Interdisciplinary Goals of Care Family Meeting   Date carried out:: 11/16/2022  Location of the meeting:  Patient's Girl Friend and brother at bedside and Patient's [facility]) over the phone  Member's involved: Physician, Bedside Registered Nurse, and Family Member or next of kin  Durable Power of Attorney or acting medical decision maker: Kathrine Cords    Discussion: We discussed goals of care for [employer] .    The Clinical status was relayed to patient's family at bedside and over the phone in detail.   Updated and notified of patients medical condition.     Patient remains unresponsive Patient is having a weak cough and struggling to remove secretions.   Patient with increased WOB and using accessory muscles to breathe Explained to family course of therapy and the modalities  Evidence of kidney failure   Patient with Progressive multiorgan failure with a very high probablity of a very minimal chance of meaningful recovery despite all aggressive and optimal medical therapy.  Code status: Full DNR  Disposition: In-patient comfort care    Family are satisfied with Plan of action and management. All questions answered   Cheri Fowler MD [facility] See Amion for pager If no response to pager, please call [phone] until 7pm After 7pm, Please call E-link [phone]

## 2022-12-01 NOTE — Progress Notes (Signed)
eLink Physician-Brief Progress Note Patient Name: Austin Berry DOB: Mar 03, 1966 MRN: [id number]   Date of Service  11/25/2022  HPI/Events of Note  Received request to change PO hydralazine to IV as patient is NPO  eICU Interventions  Ordered hydralazine 10 mg IV q 6 prn     Intervention Category Intermediate Interventions: Hypertension - evaluation and management  Darl Pikes 11/08/2022, 5:23 AM

## 2022-12-01 NOTE — Progress Notes (Signed)
Patients head turned @ 0435 by 2 RT's and 1 RN.

## 2022-12-01 NOTE — Procedures (Signed)
Arterial Catheter Insertion Procedure Note  Austin Berry  [id number]  04-Dec-1965  Date:11/10/2022  Time:1:51 AM    Provider Performing: Leafy Half    Procedure: Insertion of Arterial Line (46962) without US guidance  Indication(s) Blood pressure monitoring and/or need for frequent ABGs  Consent Unable to obtain consent due to emergent nature of procedure.  Anesthesia None   Time Out Verified patient identification, verified procedure, site/side was marked, verified correct patient position, special equipment/implants available, medications/allergies/relevant history reviewed, required imaging and test results available.   Sterile Technique Maximal sterile technique including full sterile barrier drape, hand hygiene, sterile gown, sterile gloves, mask, hair covering, sterile ultrasound probe cover (if used).   Procedure Description Area of catheter insertion was cleaned with chlorhexidine and draped in sterile fashion. Without real-time ultrasound guidance an arterial catheter was placed into the left radial artery.  Appropriate arterial tracings confirmed on monitor.     Complications/Tolerance None; patient tolerated the procedure well.   EBL Minimal   Specimen(s) None

## 2022-12-01 NOTE — Progress Notes (Addendum)
PCCM Interval Progress Note  Called to bedside for ongoing desaturations and hypoxia despite progressive increase in vent settings overnight. Initially on 60% and 10 PEEP which have been increased to 100% and 14 PEEP. Sats briefly improved from mid 70s to 90 but then dropped back town and sustained around 80.  Recruitment maneuvers attempted by RT without much improvement.  Peak pressures around 30, Plateaus around 28-30. Suction catheter easily passed, not much in terms of secretions. CXR obtained, some vascular congestion. Given an extra 60mg  Lasix dose without much UOP. ABG 7.27/50/65.  Pt sedated well on Fentanyl, Precedex, Midazolam. No vent dyssynchrony. BIS 55.  Given no improvement despite above measures, will proceed with proning per usual ARDS protocol with 16:8 ratio planned. Nimbex infusion added. Arterial line already placed by RT. Will need follow up ABG and follow up CXR post proning. ELINK to follow ABG/CXR.   Attempts at calling family unsuccessful as no answer for both RN and myself.   CC time: 35 minutes.   Rutherford Guys, PA - [facility] For pager details, please see AMION or use Epic chat  After 1900, please call ELINK for cross coverage needs 11/12/2022, 1:57 AM

## 2022-12-01 NOTE — TOC Progression Note (Signed)
Transition of Care [facility]) - Progression Note    Patient Details  Name: Austin Berry MRN: [id number] Date of Birth: 12-Mar-1966  [facility] CM/SW Contact  Tom-Johnson, Hershal Coria, RN Phone Number: 11/12/2022, 12:16 PM  Clinical Narrative:     GOC meeting today with family. Patient extubated today, on room air. Transitioned to Comfort Care per family's request.   CM will continue to follow and render compassionate support at this tie.         Expected Discharge Plan and Services                                               Social Determinants of Health (SDOH) Interventions SDOH Screenings   Tobacco Use: High Risk (10/29/2022)    Readmission Risk Interventions    10/30/2022    1:17 PM  Readmission Risk Prevention Plan  Transportation Screening Complete  PCP or Specialist Appt within 3-5 Days Complete  HRI or Home Care Consult Complete  Social Work Consult for Recovery Care Planning/Counseling Complete  Palliative Care Screening Not Applicable  Medication Review Oceanographer) Referral to Pharmacy

## 2022-12-01 NOTE — Progress Notes (Signed)
Attempted to call patient's daughter Amil Amen to obtain Arterial Line consent. No answer was received. Per E-link MD, okay to start arterial line via emergency consent.

## 2022-12-01 NOTE — Progress Notes (Signed)
Nutrition Brief Note  Chart reviewed. Pt transitioning to comfort care.  No further nutrition interventions planned at this time. Will discontinue order for tube feeds. Please re-consult as needed.    Mertie Clause, MS, RD, LDN Registered Dietitian II Please see AMiON for contact information.

## 2022-12-01 NOTE — Progress Notes (Signed)
   Chaplain resonded to call in the setting of patient death. Chaplain introduced spiritual care and offered support. Chaplain identified family's challenges as well as strengths and coping skills through reflective listening. They've been in the hospital with Austin Berry for 2 weeks and are grateful for the good care that Press has received. They are finding comfort in knowing that he has been peaceful for the entire time. They were initially hopeful of his recovery, but have come to terms with losing him. They are grateful for the support of family and their faith community and are ready to head back home to [place].  Maryanna Shape. Carley Hammed, M.Div. [facility] Chaplain Pager [phone] Office [phone]    11/10/2022 1227  Spiritual Encounters  Type of Visit Initial  Care provided to: Family  Referral source Nurse (RN/NT/LPN)  Reason for visit Patient death  Spiritual Framework  Presenting Themes Significant life change;Caregiving needs;Coping tools  Community/Connection Family;Faith community  Interventions  Spiritual Care Interventions Made Established relationship of care and support;Compassionate presence;Reflective listening;Normalization of emotions  Intervention Outcomes  Outcomes Connection to spiritual care;Awareness of support

## 2022-12-01 NOTE — Accreditation Note (Signed)
Death within 24 hours of soft bilateral wrist restraints logged 11/15/2022 at 0805 by Laurene Footman RN.

## 2022-12-01 NOTE — Progress Notes (Signed)
eLink Physician-Brief Progress Note Patient Name: Austin Berry DOB: 1966/03/31 MRN: [id number]   Date of Service  11/14/2022  HPI/Events of Note  Follow up cxr post proning O2 sat 98%  eICU Interventions  Increased central vascular congestion likely related to the change in positioning. No focal confluent infiltrate is seen.   Tubes and lines in satisfactory position.     Intervention Category Intermediate Interventions: Diagnostic test evaluation  Darl Pikes 11/21/2022, 5:06 AM

## 2022-12-01 NOTE — Progress Notes (Signed)
eLink Physician-Brief Progress Note Patient Name: Austin Berry DOB: 1966-03-04 MRN: [id number]   Date of Service  11/15/2022  HPI/Events of Note  Received request to increase Fentanyl back to 400 mcg/hr Now on Versed drip at 7 and Precedex at 1.2, adequately sedated, proned paO2 now at 81 from 65 CXR with perihilar infiltrates  eICU Interventions  Will keep Fentanyl at 300 mcg/hr as max dose Hold off on tube feeding as with significant bilious output Discussed with bedside RN     Intervention Category Intermediate Interventions: Other:  Darl Pikes 11/02/2022, 3:38 AM
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
## 2022-12-01 NOTE — Progress Notes (Signed)
Patient proned at 0254 per order by 2 RT's and 5 RN's.  ETT secure, VSS.

## 2022-12-01 NOTE — Procedures (Signed)
Extubation Procedure Note  Patient Details:   Name: Austin Berry DOB: 02-21-1966 MRN: [id number]   Airway Documentation:    Vent end date: 11/15/2022 Vent end time: 1125   Evaluation  O2 sats: stable throughout Complications: No apparent complications Patient did tolerate procedure well. Bilateral Breath Sounds: Diminished, Clear   No,pt could not speak post extubation.  Pt extubated to room air per physician's order and in accordance with the family's wishes.  Audrie Lia 11/03/2022, 11:36 AM

## 2022-12-01 DEATH — deceased
